# Patient Record
Sex: Male | Born: 1955 | Race: White | Hispanic: No | Marital: Married | Smoking: Never smoker
Health system: Southern US, Community
[De-identification: ages and names within clinical notes are randomized; demographics above are authoritative.]

## PROBLEM LIST (undated history)

## (undated) DIAGNOSIS — B1910 Unspecified viral hepatitis B without hepatic coma: Secondary | ICD-10-CM

## (undated) HISTORY — DX: Unspecified viral hepatitis B without hepatic coma: B19.10

---

## 2005-12-01 ENCOUNTER — Emergency Department (HOSPITAL_COMMUNITY): Admission: EM | Admit: 2005-12-01 | Discharge: 2005-12-01 | Payer: Self-pay | Admitting: Emergency Medicine

## 2006-12-30 ENCOUNTER — Ambulatory Visit: Payer: Self-pay | Admitting: Internal Medicine

## 2007-01-08 ENCOUNTER — Ambulatory Visit: Payer: Self-pay | Admitting: Internal Medicine

## 2007-01-08 LAB — CONVERTED CEMR LAB
ALT: 33 units/L (ref 0–40)
Albumin: 4 g/dL (ref 3.5–5.2)
Alkaline Phosphatase: 50 units/L (ref 39–117)
Bilirubin, Direct: 0.1 mg/dL (ref 0.0–0.3)
Calcium: 9.9 mg/dL (ref 8.4–10.5)
Direct LDL: 150.6 mg/dL
Eosinophils Relative: 0.7 % (ref 0.0–5.0)
GFR calc Af Amer: 102 mL/min
GFR calc non Af Amer: 84 mL/min
Glucose, Bld: 119 mg/dL — ABNORMAL HIGH (ref 70–99)
HCT: 44.1 % (ref 39.0–52.0)
HDL: 35.6 mg/dL — ABNORMAL LOW (ref >39.0)
Hemoglobin, Urine: NEGATIVE
Ketones, ur: NEGATIVE mg/dL
Lymphocytes Relative: 31.8 % (ref 12.0–46.0)
Monocytes Absolute: 0.4 10*3/uL (ref 0.2–0.7)
Monocytes Relative: 5.4 % (ref 3.0–11.0)
Neutro Abs: 4.7 10*3/uL (ref 1.4–7.7)
Neutrophils Relative %: 61.7 % (ref 43.0–77.0)
Potassium: 4.5 meq/L (ref 3.5–5.1)
RBC: 5.06 M/uL (ref 4.22–5.81)
RDW: 12 % (ref 11.5–14.6)
Sodium: 145 meq/L (ref 135–145)
Specific Gravity, Urine: 1.02 (ref 1.000–1.03)
Total Bilirubin: 1.5 mg/dL — ABNORMAL HIGH (ref 0.3–1.2)
Total CHOL/HDL Ratio: 6.6
Triglycerides: 240 mg/dL (ref 0–149)
Urine Glucose: NEGATIVE mg/dL
pH: 8 (ref 5.0–8.0)

## 2007-05-12 ENCOUNTER — Encounter: Payer: Self-pay | Admitting: *Deleted

## 2008-07-26 IMAGING — CR DG CHEST 2V
2 series · 2 of 2 positions shown · non-contrast
Comparison: none

CLINICAL DATA: Physical examination.  
 CHEST - 2 VIEW - 12/30/06:

[view not recorded (1 of 2)]
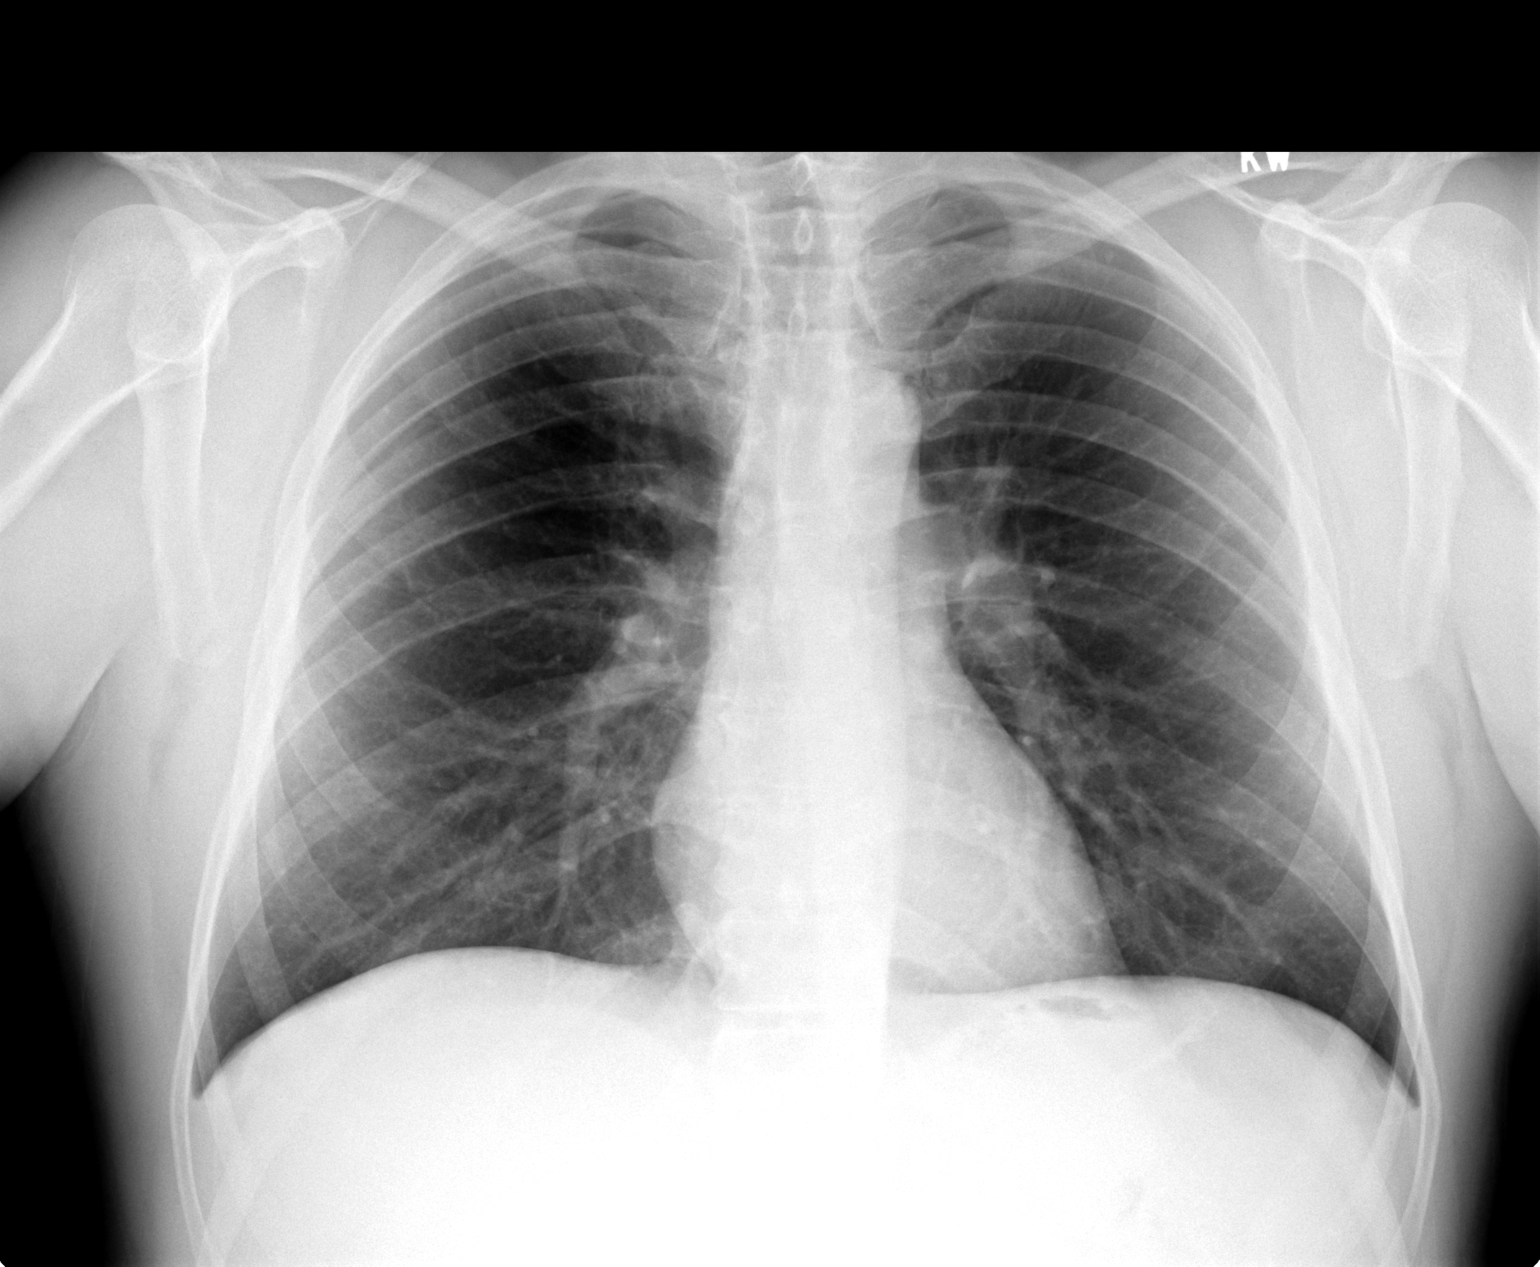

[view not recorded (2 of 2)]
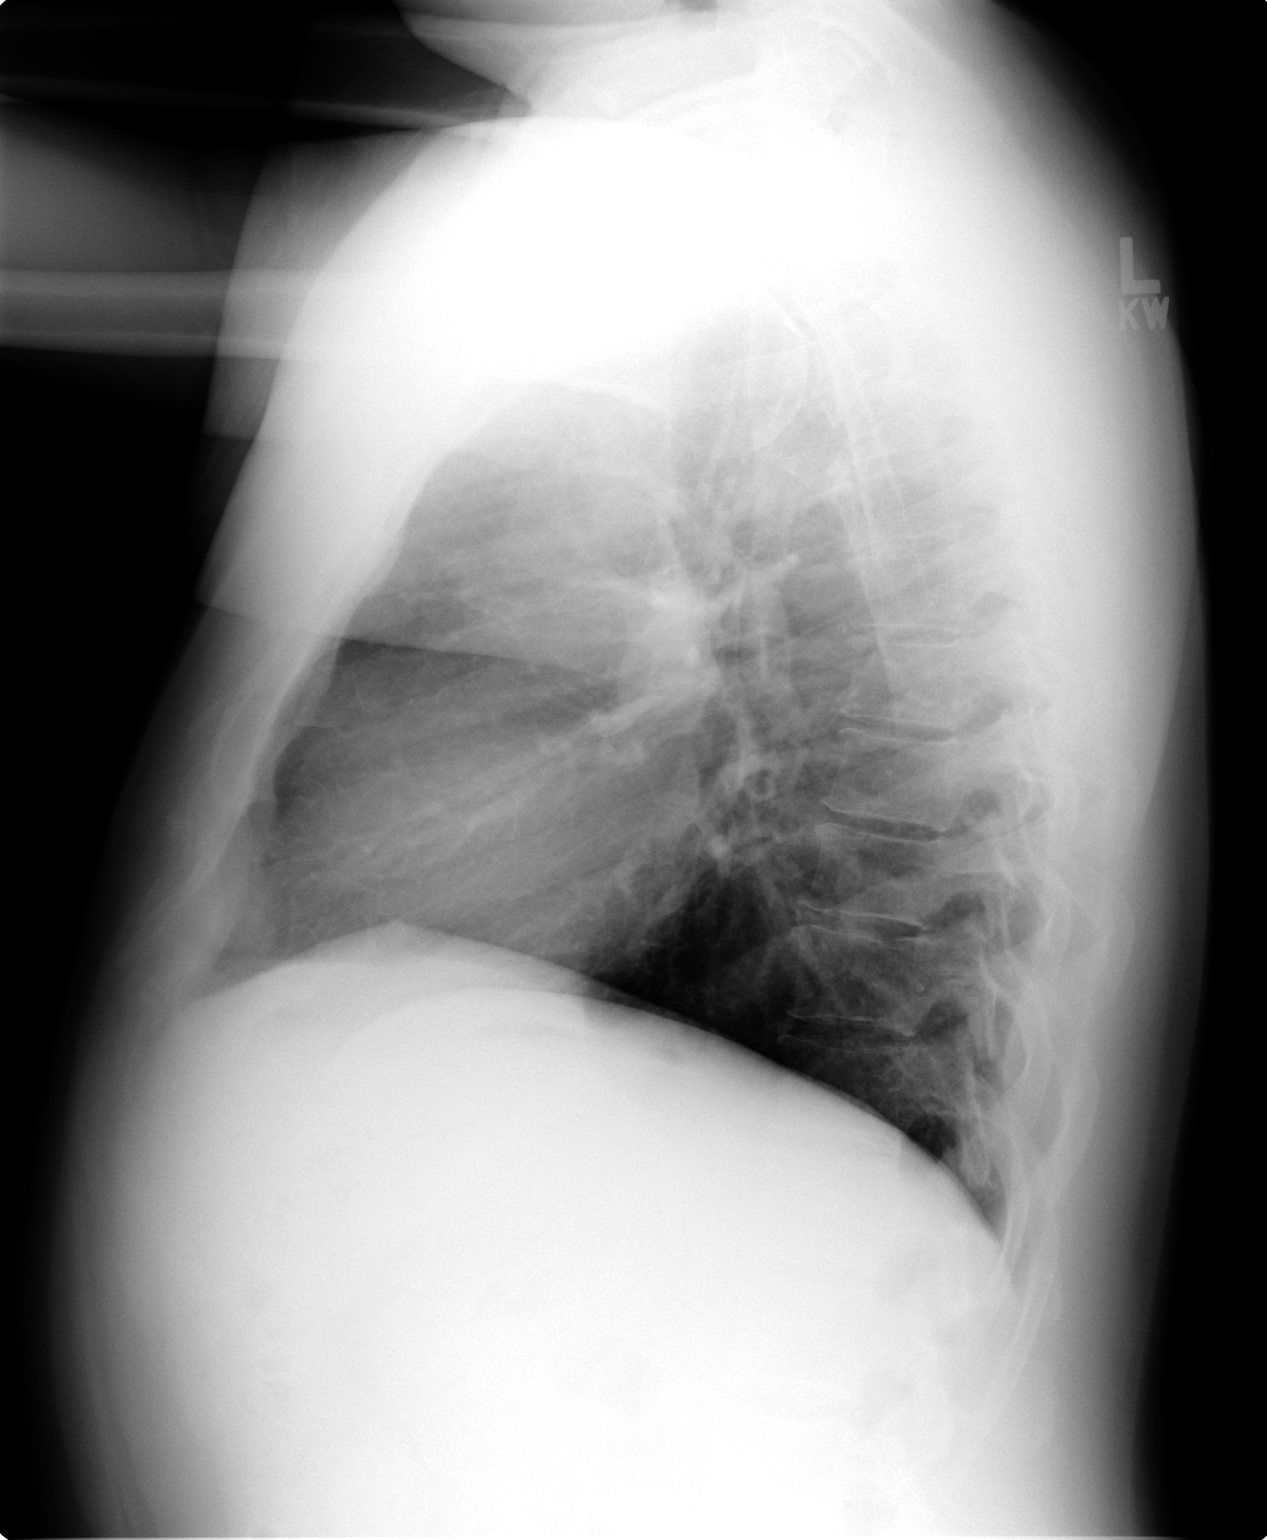

[2 of 2 positions shown; findings below may reference images not displayed]

FINDINGS: The lungs are clear and the heart and mediastinal structures are normal.
IMPRESSION: No evidence for active chest disease.

## 2011-02-09 NOTE — Assessment & Plan Note (Signed)
[facility]                           PRIMARY CARE OFFICE NOTE   NAME:Andrew Mccoy                      MRN:          [id number]  DATE:12/30/2006                            DOB:          02-08-1956    HISTORY OF PRESENT ILLNESS:  The patient is a 55 year old male who  presents for wellness examination.   PAST MEDICAL HISTORY:  Kidney stone on the right.   PAST SURGICAL HISTORY:  None.   ALLERGIES:  None.   MEDICATIONS:  None.   SOCIAL HISTORY:  He is an Nurse, mental health at [employer], originally  from [place].  He has one daughter who is 67 years old.  Quit  smoking 9 years ago.  No alcohol.   FAMILY HISTORY:  Father is 11 and living.  Mother died at age 37 with  myocardial infarctions.  One half sister as well.   REVIEW OF SYSTEMS:  He has been physically very active.  No chest pain  or shortness of breath.  No syncope.  No weight loss.  No blood in the  stools.  The rest of the 18-point review of systems is negative.   PHYSICAL EXAMINATION:  GENERAL:  He was in no acute distress.  VITAL SIGNS:  Blood pressure 123/78, pulse 73, temperature 97.4, weight  189 pounds.  HEENT:  Moist mucosa.  NECK:  Supple.  No thyromegaly or bruits.  LUNGS:  Clear to auscultation and percussion.  No wheezes or rhonchi.  HEART:  S1, S2.  No murmurs or gallops.  ABDOMEN:  Soft, nontender without organomegaly.  EXTREMITIES:  Lower extremities without edema.  NEUROLOGICAL:  He is alert, oriented and cooperative.  Denies being  depressed.  SKIN:  White papular rash over the shaved scalp area.  There is a  0.5x0.3 cm oval elastic mobile mass over the left cheek bone.   LABORATORY DATA:  None available.  He tells me that his PSA was normal 3-  6 months ago, checked by a urologist.   ASSESSMENT/PLAN:  1. Normal wellness examination.  Age/health related issues discussed.      Healthy lifestyle discussed, continue with physical activity.  Mediterranean diet.  2. Scalp rash: likely contact dermatitis.  Hydrocortisone 2.5% lotion      to use b.i.d. p.r.n.  May need to use an electric shaver.  3. Lesion on the left cheek noticed two months ago, likely a lipoma.      If grows, advised to see a dermatologist or plastic surgeon to      perform removal/biopsy.  He understands.  Otherwise, I can see him      back in a year.  4. Obtained lab work appropriate for age.     Andrew Quint. Plotnikov, MD  Electronically Signed    AVP/MedQ  DD: 01/05/2007  DT: 01/05/2007  Job #: [id number]

## 2015-04-27 ENCOUNTER — Emergency Department (HOSPITAL_COMMUNITY)
Admission: EM | Admit: 2015-04-27 | Discharge: 2015-04-27 | Disposition: A | Discharge status: Home / Self Care | Payer: PRIVATE HEALTH INSURANCE | Attending: Emergency Medicine | Admitting: Emergency Medicine

## 2015-04-27 ENCOUNTER — Encounter (HOSPITAL_COMMUNITY): Payer: Self-pay

## 2015-04-27 ENCOUNTER — Emergency Department (HOSPITAL_COMMUNITY): Payer: PRIVATE HEALTH INSURANCE

## 2015-04-27 DIAGNOSIS — Y9355 Activity, bike riding: Secondary | ICD-10-CM | POA: Insufficient documentation

## 2015-04-27 DIAGNOSIS — Y999 Unspecified external cause status: Secondary | ICD-10-CM | POA: Diagnosis not present

## 2015-04-27 DIAGNOSIS — Y92410 Unspecified street and highway as the place of occurrence of the external cause: Secondary | ICD-10-CM | POA: Insufficient documentation

## 2015-04-27 DIAGNOSIS — R0781 Pleurodynia: Secondary | ICD-10-CM

## 2015-04-27 DIAGNOSIS — S4991XA Unspecified injury of right shoulder and upper arm, initial encounter: Secondary | ICD-10-CM | POA: Insufficient documentation

## 2015-04-27 DIAGNOSIS — M549 Dorsalgia, unspecified: Secondary | ICD-10-CM

## 2015-04-27 DIAGNOSIS — Z8619 Personal history of other infectious and parasitic diseases: Secondary | ICD-10-CM | POA: Diagnosis not present

## 2015-04-27 DIAGNOSIS — S299XXA Unspecified injury of thorax, initial encounter: Secondary | ICD-10-CM | POA: Diagnosis not present

## 2015-04-27 MED ORDER — METHOCARBAMOL 500 MG PO TABS: 500.0000 mg | ORAL_TABLET | Freq: Three times a day (TID) | ORAL | Status: DC | PRN

## 2015-04-27 MED ORDER — METHOCARBAMOL 500 MG PO TABS
1000.0000 mg | ORAL_TABLET | Freq: Once | ORAL | Status: AC
  Administered 2015-04-27: 1000 mg via ORAL
  Filled 2015-04-27: qty 2

## 2015-04-27 MED ORDER — OXYCODONE-ACETAMINOPHEN 5-325 MG PO TABS: 2.0000 | ORAL_TABLET | Freq: Once | ORAL | Status: DC

## 2015-04-27 MED ORDER — OXYCODONE-ACETAMINOPHEN 5-325 MG PO TABS
2.0000 | ORAL_TABLET | Freq: Once | ORAL | Status: AC
  Administered 2015-04-27: 2 via ORAL
  Filled 2015-04-27: qty 2
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
## 2015-04-27 NOTE — Discharge Instructions (Signed)
Heat Therapy Heat therapy can help ease sore, stiff, injured, and tight muscles and joints. Heat relaxes your muscles, which may help ease your pain.  RISKS AND COMPLICATIONS If you have any of the following conditions, do not use heat therapy unless your health care provider has approved:  Poor circulation.  Healing wounds or scarred skin in the area being treated.  Diabetes, heart disease, or high blood pressure.  Not being able to feel (numbness) the area being treated.  Unusual swelling of the area being treated.  Active infections.  Blood clots.  Cancer.  Inability to communicate pain. This may include young children and people who have problems with their brain function (dementia).  Pregnancy. Heat therapy should only be used on old, pre-existing, or long-lasting (chronic) injuries. Do not use heat therapy on new injuries unless directed by your health care provider. HOW TO USE HEAT THERAPY There are several different kinds of heat therapy, including:  Moist heat pack.  Warm water bath.  Hot water bottle.  Electric heating pad.  Heated gel pack.  Heated wrap.  Electric heating pad. Use the heat therapy method suggested by your health care provider. Follow your health care provider's instructions on when and how to use heat therapy. GENERAL HEAT THERAPY RECOMMENDATIONS  Do not sleep while using heat therapy. Only use heat therapy while you are awake.  Your skin may turn pink while using heat therapy. Do not use heat therapy if your skin turns red.  Do not use heat therapy if you have new pain.  High heat or long exposure to heat can cause burns. Be careful when using heat therapy to avoid burning your skin.  Do not use heat therapy on areas of your skin that are already irritated, such as with a rash or sunburn. SEEK MEDICAL CARE IF:  You have blisters, redness, swelling, or numbness.  You have new pain.  Your pain is worse. MAKE SURE  YOU:  Understand these instructions.  Will watch your condition.  Will get help right away if you are not doing well or get worse. Document Released: 12/03/2011 Document Revised: 01/25/2014 Document Reviewed: 11/03/2013 [facility] Patient Information 2015 [place], [place]. This information is not intended to replace advice given to you by your health care provider. Make sure you discuss any questions you have with your health care provider.  Musculoskeletal Pain Musculoskeletal pain is muscle and boney aches and pains. These pains can occur in any part of the body. Your caregiver may treat you without knowing the cause of the pain. They may treat you if blood or urine tests, X-rays, and other tests were normal.  CAUSES There is often not a definite cause or reason for these pains. These pains may be caused by a type of germ (virus). The discomfort may also come from overuse. Overuse includes working out too hard when your body is not fit. Boney aches also come from weather changes. Bone is sensitive to atmospheric pressure changes. HOME CARE INSTRUCTIONS   Ask when your test results will be ready. Make sure you get your test results.  Only take over-the-counter or prescription medicines for pain, discomfort, or fever as directed by your caregiver. If you were given medications for your condition, do not drive, operate machinery or power tools, or sign legal documents for 24 hours. Do not drink alcohol. Do not take sleeping pills or other medications that may interfere with treatment.  Continue all activities unless the activities cause more pain. When the pain  lessens, slowly resume normal activities. Gradually increase the intensity and duration of the activities or exercise.  During periods of severe pain, bed rest may be helpful. Lay or sit in any position that is comfortable.  Putting ice on the injured area.  Put ice in a bag.  Place a towel between your skin and the bag.  Leave the  ice on for 15 to 20 minutes, 3 to 4 times a day.  Follow up with your caregiver for continued problems and no reason can be found for the pain. If the pain becomes worse or does not go away, it may be necessary to repeat tests or do additional testing. Your caregiver may need to look further for a possible cause. SEEK IMMEDIATE MEDICAL CARE IF:  You have pain that is getting worse and is not relieved by medications.  You develop chest pain that is associated with shortness or breath, sweating, feeling sick to your stomach (nauseous), or throw up (vomit).  Your pain becomes localized to the abdomen.  You develop any new symptoms that seem different or that concern you. MAKE SURE YOU:   Understand these instructions.  Will watch your condition.  Will get help right away if you are not doing well or get worse. Document Released: 09/10/2005 Document Revised: 12/03/2011 Document Reviewed: 05/15/2013 [facility] Patient Information 2015 [place], [place]. This information is not intended to replace advice given to you by your health care provider. Make sure you discuss any questions you have with your health care provider.  Thoracic Strain Thoracic strain is an injury to the muscles of the upper back. A mild strain may take only 1 week to heal. Torn muscles or tendons may take 6 weeks to 2 months to heal. HOME CARE  Put ice on the injured area.  Put ice in a plastic bag.  Place a towel between your skin and the bag.  Leave the ice on for 15-20 minutes, 03-04 times a day, for the first 2 days.  Only take medicine as told by your doctor.  Go to physical therapy and perform exercises as told by your doctor.  Use wraps and back braces as told by your doctor.  Warm up before being active. GET HELP RIGHT AWAY IF:   There is more bruising, puffiness (swelling), or pain.  Medicine does not help the pain.  You have trouble breathing, chest pain, or a fever.  Your problems seem to be  getting worse, not better. MAKE SURE YOU:   Understand these instructions.  Will watch your condition.  Will get help right away if you are not doing well or get worse. Document Released: 02/27/2008 Document Revised: 12/03/2011 Document Reviewed: 10/30/2010 [facility] Patient Information 2015 [place], [place]. This information is not intended to replace advice given to you by your health care provider. Make sure you discuss any questions you have with your health care provider.

## 2015-04-27 NOTE — ED Notes (Signed)
Patient transported to X-ray 
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
## 2015-06-21 ENCOUNTER — Other Ambulatory Visit: Payer: Self-pay

## 2015-12-15 ENCOUNTER — Encounter: Payer: Self-pay | Admitting: Gastroenterology

## 2015-12-15 ENCOUNTER — Ambulatory Visit (INDEPENDENT_AMBULATORY_CARE_PROVIDER_SITE_OTHER): Payer: PRIVATE HEALTH INSURANCE | Admitting: Gastroenterology

## 2015-12-15 VITALS — BP 130/78 | HR 76 | Ht 65.75 in | Wt 175.4 lb

## 2015-12-15 DIAGNOSIS — Z1211 Encounter for screening for malignant neoplasm of colon: Secondary | ICD-10-CM

## 2015-12-15 DIAGNOSIS — K648 Other hemorrhoids: Secondary | ICD-10-CM | POA: Diagnosis not present

## 2015-12-15 DIAGNOSIS — Z8619 Personal history of other infectious and parasitic diseases: Secondary | ICD-10-CM

## 2015-12-15 DIAGNOSIS — K629 Disease of anus and rectum, unspecified: Secondary | ICD-10-CM | POA: Diagnosis not present

## 2015-12-15 MED ORDER — NA SULFATE-K SULFATE-MG SULF 17.5-3.13-1.6 GM/177ML PO SOLN: 1.0000 | Freq: Once | ORAL | Status: DC

## 2015-12-15 NOTE — Patient Instructions (Signed)
Your physician has requested that you go to the basement for lab work before leaving today.  You have been scheduled for an appointment with Dr. Redmond Pulling at [facility]. Your appointment is on 12/22/15 arriving at 4:00pm for registration. Make certain to bring a list of current medications, including any over the counter medications or vitamins. Also bring your co-pay if you have one as well as your insurance cards. [facility] is located at [address]. Should you need to reschedule your appointment, please contact them at [phone].  You have been scheduled for a colonoscopy. Please follow written instructions given to you at your visit today.  Please pick up your prep supplies at the pharmacy within the next 1-3 days. If you use inhalers (even only as needed), please bring them with you on the day of your procedure. Your physician has requested that you go to www.[email or web address] and enter the access code given to you at your visit today. This web site gives a general overview about your procedure. However, you should still follow specific instructions given to you by our office regarding your preparation for the procedure.

## 2015-12-15 NOTE — Progress Notes (Signed)
HPI :  60 y/o male seen in consultation for a "nodule" near his rectum. He reports a PMH of hepatitis B  He reports about 3 months ago he noticed an abnormality of his perianal area. He has a nodule which is there all the time. He denies any perianal pain. He denies any rectal bleeding. He denies any constipation or diarrhea, regular bowel movements. No abdominal pains. No weight loss. Good appetite, he is eating well. No FH of colon cancer. No prior colonoscopy or colon cancer screening. This doesn't really bother him in any way but he is anxious about what it could be.   I otherwise asked him about his history of hepatitis B. He states he was diagnosed when in [place] in 1988. He reports he was "treated" for it while in [place] but does not know any details of this. He thinks it has "gone away" but he is not sure. He denies any problems with his liver. He denies any history of jaundice. No FH of [place]. He is otherwise in good health. No prior operations.   Past Medical History  Diagnosis Date  . Hepatitis B      History reviewed. No pertinent past surgical history. Family History  Problem Relation Age of Onset  . Heart disease Mother   . Heart disease Father    Social History  Substance Use Topics  . Smoking status: Never Smoker   . Smokeless tobacco: Never Used  . Alcohol Use: No   Current Outpatient Prescriptions  Medication Sig Dispense Refill  . Na Sulfate-K Sulfate-Mg Sulf SOLN Take 1 kit by mouth once. 354 mL 0   No current facility-administered medications for this visit.   No Known Allergies   Review of Systems: All systems reviewed and negative except where noted in HPI.   Lab Results  Component Value Date   WBC 7.6 01/08/2007   HGB 15.3 01/08/2007   HCT 44.1 01/08/2007   MCV 87.0 01/08/2007   PLT 325 01/08/2007    Lab Results  Component Value Date   CREATININE 1.0 01/08/2007   BUN 11 01/08/2007   NA 145 01/08/2007   K 4.5 01/08/2007   CL 109 01/08/2007    CO2 30 01/08/2007   Lab Results  Component Value Date   ALT 33 01/08/2007   AST 26 01/08/2007   ALKPHOS 50 01/08/2007   BILITOT 1.5* 01/08/2007     Physical Exam: BP 130/78 mmHg  Pulse 76  Ht 5' 5.75" (1.67 m)  Wt 175 lb 6 oz (79.55 kg)  BMI 28.52 kg/m2 Constitutional: Pleasant,well-developed, male in no acute distress. HEENT: Normocephalic and atraumatic. Conjunctivae are normal. No scleral icterus. Neck supple.  Cardiovascular: Normal rate, regular rhythm.  Pulmonary/chest: Effort normal and breath sounds normal. No wheezing, rales or rhonchi. Abdominal: Soft, nondistended, nontender. Bowel sounds active throughout. There are no masses palpable. No hepatomegaly. Rectal exam: perianal nodule / cyst on the right lateral side, roughly 1cm in size, firm to touch, nontender without purulence. Internal hemorrhoids noted on anoscopy. No mass on DRE or anoscopy otherwise. Extremities: no edema Lymphadenopathy: No cervical adenopathy noted. Neurological: Alert and oriented to person place and time. Skin: Skin is warm and dry. No rashes noted. Psychiatric: Normal mood and affect. Behavior is normal.   ASSESSMENT AND PLAN: 60 y/o male with a reported history of hepatitis B presenting with perianal findings as above. It appears to possibly be a cyst or subepithelial nodule, seems atypical for hemorrhoidal tissue. Anoscopy shows internal  hemorrhoids but otherwise no mass lesions. I counseled the patient I think this is a benign finding however recommend he have this evaluated by surgery to discuss removing it and to clarify what it is. He agreed with this consultation. He otherwise has no prior colon cancer screening and I discussed colon cancer screening with him. Following our conversation he wished to proceed with optical colonoscopy. The indications, risks, and benefits of colonoscopy were explained to the patient in detail. Risks include but are not limited to bleeding, perforation,  adverse reaction to medications, and cardiopulmonary compromise. Sequelae include but are not limited to the possibility of surgery, hospitalization, and mortality. The patient verbalized understanding and wished to proceed. All questions answered, referred to the scheduler and bowel prep ordered. Further recommendations pending results of the exam.   He otherwise has not been followed for hepatitis B but reports a history of it, although the details of this are unclear. I offered him lab testing to include LFTs, testing for hepatitis B, C, and immunity to A to help sort this out initially. If HBV surface AG is positive will need HBV DNA, HBV E AG / AB and US of the liver. He agreed.   Ramireno Cellar, MD [facility] Pager [phone]  CC: Leighton Ruff, MD

## 2016-01-12 ENCOUNTER — Encounter: Payer: Self-pay | Admitting: Gastroenterology

## 2016-04-16 ENCOUNTER — Other Ambulatory Visit: Payer: Self-pay | Admitting: Family Medicine

## 2016-04-17 LAB — CMP12+LP+TP+TSH+6AC+PSA+CBC…
ALBUMIN: 4.7 g/dL (ref 3.5–5.5)
ALK PHOS: 56 IU/L (ref 39–117)
ALT: 23 IU/L (ref 0–44)
AST: 23 IU/L (ref 0–40)
Albumin/Globulin Ratio: 2.4 — ABNORMAL HIGH (ref 1.2–2.2)
BASOS ABS: 0 10*3/uL (ref 0.0–0.2)
BASOS: 1 %
BILIRUBIN TOTAL: 0.9 mg/dL (ref 0.0–1.2)
BUN / CREAT RATIO: 12 (ref 9–20)
BUN: 12 mg/dL (ref 6–24)
CALCIUM: 9.4 mg/dL (ref 8.7–10.2)
CHLORIDE: 102 mmol/L (ref 96–106)
CHOL/HDL RATIO: 4.9 ratio (ref 0.0–5.0)
CHOLESTEROL TOTAL: 163 mg/dL (ref 100–199)
Creatinine, Ser: 1.04 mg/dL (ref 0.76–1.27)
EOS (ABSOLUTE): 0 10*3/uL (ref 0.0–0.4)
EOS: 1 %
ESTIMATED CHD RISK: 1 times avg. (ref 0.0–1.0)
FREE THYROXINE INDEX: 1.7 (ref 1.2–4.9)
GFR calc non Af Amer: 79 mL/min/{1.73_m2} (ref >59)
GFR, EST AFRICAN AMERICAN: 91 mL/min/{1.73_m2} (ref >59)
GGT: 27 IU/L (ref 0–65)
GLUCOSE: 102 mg/dL — AB (ref 65–99)
Globulin, Total: 2 g/dL (ref 1.5–4.5)
HDL: 33 mg/dL — ABNORMAL LOW (ref >39)
HEMATOCRIT: 37.8 % (ref 37.5–51.0)
HEMOGLOBIN: 12.6 g/dL (ref 12.6–17.7)
IMMATURE GRANULOCYTES: 1 %
Immature Grans (Abs): 0.1 10*3/uL (ref 0.0–0.1)
Iron: 85 ug/dL (ref 38–169)
LDH: 415 IU/L — AB (ref 121–224)
LDL CALC: 106 mg/dL — AB (ref 0–99)
Lymphocytes Absolute: 1.5 10*3/uL (ref 0.7–3.1)
Lymphs: 28 %
MCH: 29.4 pg (ref 26.6–33.0)
MCHC: 33.3 g/dL (ref 31.5–35.7)
MCV: 88 fL (ref 79–97)
MONOCYTES: 3 %
Monocytes Absolute: 0.2 10*3/uL (ref 0.1–0.9)
NEUTROS PCT: 66 %
Neutrophils Absolute: 3.5 10*3/uL (ref 1.4–7.0)
PLATELETS: 312 10*3/uL (ref 150–379)
PROSTATE SPECIFIC AG, SERUM: 0.3 ng/mL (ref 0.0–4.0)
Phosphorus: 3.2 mg/dL (ref 2.5–4.5)
Potassium: 4.6 mmol/L (ref 3.5–5.2)
RBC: 4.28 x10E6/uL (ref 4.14–5.80)
RDW: 17.9 % — ABNORMAL HIGH (ref 12.3–15.4)
SODIUM: 140 mmol/L (ref 134–144)
T3 Uptake Ratio: 26 % (ref 24–39)
T4, Total: 6.6 ug/dL (ref 4.5–12.0)
TSH: 3.52 u[IU]/mL (ref 0.450–4.500)
Total Protein: 6.7 g/dL (ref 6.0–8.5)
Triglycerides: 120 mg/dL (ref 0–149)
Uric Acid: 7.6 mg/dL (ref 3.7–8.6)
VLDL CHOLESTEROL CAL: 24 mg/dL (ref 5–40)
WBC: 5.4 10*3/uL (ref 3.4–10.8)

## 2016-04-17 LAB — HGB A1C W/O EAG: Hgb A1c MFr Bld: 5.7 % — ABNORMAL HIGH (ref 4.8–5.6)

## 2016-08-27 DIAGNOSIS — J342 Deviated nasal septum: Secondary | ICD-10-CM | POA: Insufficient documentation

## 2016-08-27 DIAGNOSIS — M2740 Unspecified cyst of jaw: Secondary | ICD-10-CM | POA: Insufficient documentation

## 2016-08-27 DIAGNOSIS — R040 Epistaxis: Secondary | ICD-10-CM | POA: Insufficient documentation

## 2017-04-09 ENCOUNTER — Ambulatory Visit: Payer: Self-pay | Admitting: *Deleted

## 2017-04-09 VITALS — BP 125/80 | Ht 67.0 in | Wt 177.0 lb

## 2017-04-09 DIAGNOSIS — Z0000 Encounter for general adult medical examination without abnormal findings: Secondary | ICD-10-CM

## 2017-04-09 NOTE — Progress Notes (Signed)
[employer] insurance premium discount evaluation: Labs Drawn. Replacements ROI form signed. Tobacco Free Attestation form signed.  Forms placed in paper chart.  Ok to route results to PCP per pt.

## 2017-04-10 LAB — CMP12+LP+TP+TSH+6AC+PSA+CBC…
ALBUMIN: 4.9 g/dL (ref 3.5–5.5)
ALK PHOS: 65 IU/L (ref 39–117)
ALT: 22 IU/L (ref 0–44)
AST: 29 IU/L (ref 0–40)
Albumin/Globulin Ratio: 2.3 — ABNORMAL HIGH (ref 1.2–2.2)
BASOS: 0 %
BUN / CREAT RATIO: 13 (ref 9–20)
BUN: 14 mg/dL (ref 6–24)
Basophils Absolute: 0 10*3/uL (ref 0.0–0.2)
Bilirubin Total: 1.3 mg/dL — ABNORMAL HIGH (ref 0.0–1.2)
CALCIUM: 9.8 mg/dL (ref 8.7–10.2)
CHLORIDE: 100 mmol/L (ref 96–106)
CHOL/HDL RATIO: 5.5 ratio — AB (ref 0.0–5.0)
CHOLESTEROL TOTAL: 188 mg/dL (ref 100–199)
Creatinine, Ser: 1.1 mg/dL (ref 0.76–1.27)
EOS (ABSOLUTE): 0 10*3/uL (ref 0.0–0.4)
EOS: 1 %
ESTIMATED CHD RISK: 1.2 times avg. — AB (ref 0.0–1.0)
FREE THYROXINE INDEX: 1.9 (ref 1.2–4.9)
GFR calc Af Amer: 84 mL/min/{1.73_m2} (ref >59)
GFR, EST NON AFRICAN AMERICAN: 73 mL/min/{1.73_m2} (ref >59)
GGT: 25 IU/L (ref 0–65)
Globulin, Total: 2.1 g/dL (ref 1.5–4.5)
Glucose: 102 mg/dL — ABNORMAL HIGH (ref 65–99)
HDL: 34 mg/dL — ABNORMAL LOW (ref >39)
HEMATOCRIT: 40.1 % (ref 37.5–51.0)
HEMOGLOBIN: 12.8 g/dL — AB (ref 13.0–17.7)
Immature Grans (Abs): 0.2 10*3/uL — ABNORMAL HIGH (ref 0.0–0.1)
Immature Granulocytes: 3 %
Iron: 106 ug/dL (ref 38–169)
LDH: 540 IU/L — ABNORMAL HIGH (ref 121–224)
LDL CALC: 123 mg/dL — AB (ref 0–99)
LYMPHS ABS: 1.6 10*3/uL (ref 0.7–3.1)
Lymphs: 29 %
MCH: 30 pg (ref 26.6–33.0)
MCHC: 31.9 g/dL (ref 31.5–35.7)
MCV: 94 fL (ref 79–97)
Monocytes Absolute: 0.2 10*3/uL (ref 0.1–0.9)
Monocytes: 3 %
NEUTROS ABS: 3.6 10*3/uL (ref 1.4–7.0)
Neutrophils: 64 %
PHOSPHORUS: 3.9 mg/dL (ref 2.5–4.5)
Platelets: 279 10*3/uL (ref 150–379)
Potassium: 4.6 mmol/L (ref 3.5–5.2)
Prostate Specific Ag, Serum: 0.3 ng/mL (ref 0.0–4.0)
RBC: 4.27 x10E6/uL (ref 4.14–5.80)
RDW: 17.2 % — ABNORMAL HIGH (ref 12.3–15.4)
SODIUM: 139 mmol/L (ref 134–144)
T3 Uptake Ratio: 26 % (ref 24–39)
T4, Total: 7.4 ug/dL (ref 4.5–12.0)
TSH: 4.89 u[IU]/mL — AB (ref 0.450–4.500)
Total Protein: 7 g/dL (ref 6.0–8.5)
Triglycerides: 154 mg/dL — ABNORMAL HIGH (ref 0–149)
Uric Acid: 8 mg/dL (ref 3.7–8.6)
VLDL Cholesterol Cal: 31 mg/dL (ref 5–40)
WBC: 5.6 10*3/uL (ref 3.4–10.8)

## 2017-04-10 LAB — HGB A1C W/O EAG: HEMOGLOBIN A1C: 5.7 % — AB (ref 4.8–5.6)

## 2017-04-12 NOTE — Progress Notes (Signed)
Results reviewed with pt. A1c unchanged from last year. Lipid panel worsened. Bilirubin and LDH elevated. Hx hepatitis B. TSH elevated, previously normal. Hgb decreased and RDW elevated, similar to previous. Denies any s/sx blood loss. Diet and exercise recommendations for lipids and A1c discussed. Denies etoh or supplement use.  Results routed to PCP. Copy provided to pt. No further questions/concerns.

## 2017-08-20 ENCOUNTER — Ambulatory Visit: Payer: Self-pay | Admitting: Registered Nurse

## 2017-08-20 VITALS — BP 127/81 | HR 68 | Temp 97.9°F

## 2017-08-20 DIAGNOSIS — L03012 Cellulitis of left finger: Secondary | ICD-10-CM

## 2017-08-20 MED ORDER — TRIPLE ANTIBIOTIC 5-400-5000 EX OINT: TOPICAL_OINTMENT | Freq: Four times a day (QID) | CUTANEOUS | 0 refills | Status: DC

## 2017-08-20 MED ORDER — SULFAMETHOXAZOLE-TRIMETHOPRIM 800-160 MG PO TABS: 1.0000 | ORAL_TABLET | Freq: Two times a day (BID) | ORAL | 0 refills | Status: AC

## 2017-08-20 NOTE — Patient Instructions (Signed)
Laceration Care, Adult A laceration is a cut that goes through all layers of the skin. The cut also goes into the tissue that is right under the skin. Some cuts heal on their own. Others need to be closed with stitches (sutures), staples, skin adhesive strips, or wound glue. Taking care of your cut lowers your risk of infection and helps your cut to heal better. How to take care of your cut For stitches or staples:  Keep the wound clean and dry.  If you were given a bandage (dressing), you should change it at least one time per day or as told by your doctor. You should also change it if it gets wet or dirty.  Keep the wound completely dry for the first 24 hours or as told by your doctor. After that time, you may take a shower or a bath. However, make sure that the wound is not soaked in water until after the stitches or staples have been removed.  Clean the wound one time each day or as told by your doctor: ? Wash the wound with soap and water. ? Rinse the wound with water until all of the soap comes off. ? Pat the wound dry with a clean towel. Do not rub the wound.  After you clean the wound, put a thin layer of antibiotic ointment on it as told by your doctor. This ointment: ? Helps to prevent infection. ? Keeps the bandage from sticking to the wound.  Have your stitches or staples removed as told by your doctor. If your doctor used skin adhesive strips:  Keep the wound clean and dry.  If you were given a bandage, you should change it at least one time per day or as told by your doctor. You should also change it if it gets dirty or wet.  Do not get the skin adhesive strips wet. You can take a shower or a bath, but be careful to keep the wound dry.  If the wound gets wet, pat it dry with a clean towel. Do not rub the wound.  Skin adhesive strips fall off on their own. You can trim the strips as the wound heals. Do not remove any strips that are still stuck to the wound. They will fall  off after a while. If your doctor used wound glue:  Try to keep your wound dry, but you may briefly wet it in the shower or bath. Do not soak the wound in water, such as by swimming.  After you take a shower or a bath, gently pat the wound dry with a clean towel. Do not rub the wound.  Do not do any activities that will make you really sweaty until the skin glue has fallen off on its own.  Do not apply liquid, cream, or ointment medicine to your wound while the skin glue is still on.  If you were given a bandage, you should change it at least one time per day or as told by your doctor. You should also change it if it gets dirty or wet.  If a bandage is placed over the wound, do not let the tape for the bandage touch the skin glue.  Do not pick at the glue. The skin glue usually stays on for 5-10 days. Then, it falls off of the skin. General Instructions  To help prevent scarring, make sure to cover your wound with sunscreen whenever you are outside after stitches are removed, after adhesive strips are removed, or   when wound glue stays in place and the wound is healed. Make sure to wear a sunscreen of at least 30 SPF.  Take over-the-counter and prescription medicines only as told by your doctor.  If you were given antibiotic medicine or ointment, take or apply it as told by your doctor. Do not stop using the antibiotic even if your wound is getting better.  Do not scratch or pick at the wound.  Keep all follow-up visits as told by your doctor. This is important.  Check your wound every day for signs of infection. Watch for: ? Redness, swelling, or pain. ? Fluid, blood, or pus.  Raise (elevate) the injured area above the level of your heart while you are sitting or lying down, if possible. Get help if:  You got a tetanus shot and you have any of these problems at the injection site: ? Swelling. ? Very bad pain. ? Redness. ? Bleeding.  You have a fever.  A wound that was  closed breaks open.  You notice a bad smell coming from your wound or your bandage.  You notice something coming out of the wound, such as wood or glass.  Medicine does not help your pain.  You have more redness, swelling, or pain at the site of your wound.  You have fluid, blood, or pus coming from your wound.  You notice a change in the color of your skin near your wound.  You need to change the bandage often because fluid, blood, or pus is coming from the wound.  You start to have a new rash.  You start to have numbness around the wound. Get help right away if:  You have very bad swelling around the wound.  Your pain suddenly gets worse and is very bad.  You notice painful lumps near the wound or on skin that is anywhere on your body.  You have a red streak going away from your wound.  The wound is on your hand or foot and you cannot move a finger or toe like you usually can.  The wound is on your hand or foot and you notice that your fingers or toes look pale or bluish. This information is not intended to replace advice given to you by your health care provider. Make sure you discuss any questions you have with your health care provider. Document Released: 02/27/2008 Document Revised: 02/16/2016 Document Reviewed: 09/06/2014 Elsevier Interactive Patient Education  2018 [employer]. Cellulitis, Adult Cellulitis is a skin infection. The infected area is usually red and tender. This condition occurs most often in the arms and lower legs. The infection can travel to the muscles, blood, and underlying tissue and become serious. It is very important to get treated for this condition. What are the causes? Cellulitis is caused by bacteria. The bacteria enter through a break in the skin, such as a cut, burn, insect bite, open sore, or crack. What increases the risk? This condition is more likely to occur in people who:  Have a weak defense system (immune system).  Have open  wounds on the skin such as cuts, burns, bites, and scrapes. Bacteria can enter the body through these open wounds.  Are older.  Have diabetes.  Have a type of long-lasting (chronic) liver disease (cirrhosis) or kidney disease.  Use IV drugs.  What are the signs or symptoms? Symptoms of this condition include:  Redness, streaking, or spotting on the skin.  Swollen area of the skin.  Tenderness or pain  when an area of the skin is touched.  Warm skin.  Fever.  Chills.  Blisters.  How is this diagnosed? This condition is diagnosed based on a medical history and physical exam. You may also have tests, including:  Blood tests.  Lab tests.  Imaging tests.  How is this treated? Treatment for this condition may include:  Medicines, such as antibiotic medicines or antihistamines.  Supportive care, such as rest and application of cold or warm cloths (cold or warm compresses) to the skin.  Hospital care, if the condition is severe.  The infection usually gets better within 1-2 days of treatment. Follow these instructions at home:  Take over-the-counter and prescription medicines only as told by your health care provider.  If you were prescribed an antibiotic medicine, take it as told by your health care provider. Do not stop taking the antibiotic even if you start to feel better.  Drink enough fluid to keep your urine clear or pale yellow.  Do not touch or rub the infected area.  Raise (elevate) the infected area above the level of your heart while you are sitting or lying down.  Apply warm or cold compresses to the affected area as told by your health care provider.  Keep all follow-up visits as told by your health care provider. This is important. These visits let your health care provider make sure a more serious infection is not developing. Contact a health care provider if:  You have a fever.  Your symptoms do not improve within 1-2 days of starting  treatment.  Your bone or joint underneath the infected area becomes painful after the skin has healed.  Your infection returns in the same area or another area.  You notice a swollen bump in the infected area.  You develop new symptoms.  You have a general ill feeling (malaise) with muscle aches and pains. Get help right away if:  Your symptoms get worse.  You feel very sleepy.  You develop vomiting or diarrhea that persists.  You notice red streaks coming from the infected area.  Your red area gets larger or turns dark in color. This information is not intended to replace advice given to you by your health care provider. Make sure you discuss any questions you have with your health care provider. Document Released: 06/20/2005 Document Revised: 01/19/2016 Document Reviewed: 07/20/2015 Elsevier Interactive Patient Education  2017 [employer].

## 2017-08-20 NOTE — Progress Notes (Signed)
Subjective:    Patient ID: Andrew Mccoy, male    DOB: 04-22-1956, 61 y.o.   MRN: [id number]  61y/o caucasian male established Pt cut L lateral palm on piece of crystal on 11/16. Seen at [facility] see note in [facility]. Laceration sutured. Follow up visit yesterday and had sutures removed. Per [facility] notes, "wound was clean and dry with little surrounding erythema, no signs of infection. Some medial hand swelling, likely r/t healing." Wound covered with abx cream and clean dressing." Pt presents today, no dressing present. States MD yesterday told him it was a little red but would be better today, but redness is worse and now with 4 yellow "dots" at sites where sutures were removed from. Small amount of yellow drainage noted oozing from one site. Pt reports slight pain and more swelling than yesterday.  Stated applied OTC neosporin yesterday and this morning and drainage has dried up.      Review of Systems  Constitutional: Negative for chills, fatigue and fever.  HENT: Negative for trouble swallowing and voice change.   Eyes: Negative for photophobia and visual disturbance.  Respiratory: Negative for choking, shortness of breath, wheezing and stridor.   Cardiovascular: Negative for chest pain.  Gastrointestinal: Negative for nausea and vomiting.  Endocrine: Negative for cold intolerance and heat intolerance.  Genitourinary: Negative for difficulty urinating.  Musculoskeletal: Positive for joint swelling and myalgias. Negative for arthralgias, back pain, gait problem, neck pain and neck stiffness.  Skin: Positive for color change, rash and wound. Negative for pallor.  Allergic/Immunologic: Negative for food allergies.  Neurological: Negative for tremors, weakness and numbness.  Hematological: Negative for adenopathy. Does not bruise/bleed easily.  Psychiatric/Behavioral: Negative for agitation, confusion and sleep disturbance.       Objective:   Physical Exam  Constitutional: He  is oriented to person, place, and time. Vital signs are normal. He appears well-developed and well-nourished. He is cooperative.  Non-toxic appearance. He does not have a sickly appearance. He does not appear ill. No distress.  HENT:  Head: Normocephalic and atraumatic.  Right Ear: Hearing and external ear normal.  Left Ear: Hearing and external ear normal.  Nose: Nose normal.  Mouth/Throat: Uvula is midline, oropharynx is clear and moist and mucous membranes are normal. No oropharyngeal exudate.  Eyes: Conjunctivae, EOM and lids are normal. Pupils are equal, round, and reactive to light. Right eye exhibits no discharge. Left eye exhibits no discharge. No scleral icterus.  Neck: Trachea normal, normal range of motion and phonation normal. Neck supple. No neck rigidity. No tracheal deviation, no edema, no erythema and normal range of motion present.  Cardiovascular: Normal rate, regular rhythm, normal heart sounds and intact distal pulses.  Pulses:      Radial pulses are 2+ on the right side, and 2+ on the left side.  Pulmonary/Chest: Effort normal and breath sounds normal. No accessory muscle usage or stridor. No respiratory distress. He has no decreased breath sounds. He has no wheezes. He has no rhonchi. He has no rales.  Abdominal: Soft. He exhibits no distension. There is no guarding.  Musculoskeletal: Normal range of motion. He exhibits edema. He exhibits no deformity.       Right shoulder: Normal.       Left shoulder: Normal.       Right elbow: Normal.      Left elbow: Normal.       Right wrist: Normal.       Left wrist: Normal.  Right hip: Normal.       Left hip: Normal.       Right knee: Normal.       Left knee: Normal.       Cervical back: Normal.       Left hand: He exhibits tenderness, laceration and swelling. He exhibits normal range of motion, no bony tenderness, normal two-point discrimination, normal capillary refill and no deformity. Normal sensation noted. Normal  strength noted. He exhibits no finger abduction, no thumb/finger opposition and no wrist extension trouble.       Hands: Nonpitting 1+/4 edema lateral left hand and 5th digit proximal MCP joint and between MCP and PIP joints; full arom/strength equal bilaterally 5/5  Neurological: He is alert and oriented to person, place, and time. He has normal strength. He displays no atrophy and no tremor. No cranial nerve deficit or sensory deficit. He exhibits normal muscle tone. He displays no seizure activity. Coordination and gait normal. GCS eye subscore is 4. GCS verbal subscore is 5. GCS motor subscore is 6.  In/out of chair without difficulty; gait sure and steady in hallway  Skin: Skin is warm and dry. Laceration and rash noted. No abrasion, no bruising, no burn, no ecchymosis, no lesion, no petechiae and no purpura noted. Rash is macular and pustular. Rash is not papular, not maculopapular, not nodular, not vesicular and not urticarial. He is not diaphoretic. There is erythema. No cyanosis. No pallor. Nails show no clubbing.     Nonpitting edema 5th MTP soft tissue extending 1cm into hand and between MCP and PIP joints; pustules noted where sutures removed x 4; erythema hot to touch lateral finger/hand; wound edges well approximated no discharge noted  Psychiatric: He has a normal mood and affect. His speech is normal and behavior is normal. Judgment and thought content normal. Cognition and memory are normal.          Assessment & Plan:  A-left hand finger cellulitis, laceration follow up  P-bactrim ds po BID x 7 days #40 RF0 dispensed from [facility].  Continue OTC neosporin QID topical affected area until completely healed.  Exitcare handout on skin infection given to patient. RTC if worsening erythema, pain, purulent discharge, fever. Wash towels, washcloths, sheets in hot water with bleach every couple of days until infection resolved. Patient verbalized understanding, agreed with plan of care and  had no further questions at this time.   Patient was instructed to rest, ice and elevate left hand. Do not soak hand until lacerations healed avoid pool, lake, hot tub, dirty sink water. Exitcare handout on  Laceration care given to patient. Medications as directed. Call or return to clinic as needed if these symptoms worsen or fail to improve as anticipated e.g. Pain/swelling/rash.  Patient verbalized agreement and understanding of treatment plan. P2: ROM, injury prevention

## 2018-01-28 ENCOUNTER — Ambulatory Visit: Payer: Self-pay | Admitting: Registered Nurse

## 2018-01-28 VITALS — BP 113/70 | HR 63 | Temp 97.9°F

## 2018-01-28 DIAGNOSIS — J069 Acute upper respiratory infection, unspecified: Secondary | ICD-10-CM

## 2018-01-28 MED ORDER — FLUTICASONE PROPIONATE 50 MCG/ACT NA SUSP: 1.0000 | Freq: Two times a day (BID) | NASAL | 6 refills | Status: DC | PRN

## 2018-01-28 MED ORDER — SALINE SPRAY 0.65 % NA SOLN: 2.0000 | NASAL | 0 refills | Status: AC

## 2018-01-28 MED ORDER — LORATADINE 10 MG PO TABS: 10.0000 mg | ORAL_TABLET | Freq: Every day | ORAL | 1 refills | Status: DC

## 2018-01-28 MED ORDER — PHENYLEPHRINE HCL 5 MG PO TABS: 5.0000 mg | ORAL_TABLET | Freq: Four times a day (QID) | ORAL | 0 refills | Status: AC | PRN

## 2018-01-28 NOTE — Patient Instructions (Signed)
Pharyngitis Pharyngitis is redness, pain, and swelling (inflammation) of the throat (pharynx). It is a very common cause of sore throat. Pharyngitis can be caused by a bacteria, but it is usually caused by a virus. Most cases of pharyngitis get better on their own without treatment. What are the causes? This condition may be caused by:  Infection by viruses (viral). Viral pharyngitis spreads from person to person (is contagious) through coughing, sneezing, and sharing of personal items or utensils such as cups, forks, spoons, and toothbrushes.  Infection by bacteria (bacterial). Bacterial pharyngitis may be spread by touching the nose or face after coming in contact with the bacteria, or through more intimate contact, such as kissing.  Allergies. Allergies can cause buildup of mucus in the throat (post-nasal drip), leading to inflammation and irritation. Allergies can also cause blocked nasal passages, forcing breathing through the mouth, which dries and irritates the throat.  What increases the risk? You are more likely to develop this condition if:  You are 5-24 years old.  You are exposed to crowded environments such as daycare, school, or dormitory living.  You live in a cold climate.  You have a weakened disease-fighting (immune) system.  What are the signs or symptoms? Symptoms of this condition vary by the cause (viral, bacterial, or allergies) and can include:  Sore throat.  Fatigue.  Low-grade fever.  Headache.  Joint pain and muscle aches.  Skin rashes.  Swollen glands in the throat (lymph nodes).  Plaque-like film on the throat or tonsils. This is often a symptom of bacterial pharyngitis.  Vomiting.  Stuffy nose (nasal congestion).  Cough.  Red, itchy eyes (conjunctivitis).  Loss of appetite.  How is this diagnosed? This condition is often diagnosed based on your medical history and a physical exam. Your health care provider will ask you questions about  your illness and your symptoms. A swab of your throat may be done to check for bacteria (rapid strep test). Other lab tests may also be done, depending on the suspected cause, but these are rare. How is this treated? This condition usually gets better in 3-4 days without medicine. Bacterial pharyngitis may be treated with antibiotic medicines. Follow these instructions at home:  Take over-the-counter and prescription medicines only as told by your health care provider. ? If you were prescribed an antibiotic medicine, take it as told by your health care provider. Do not stop taking the antibiotic even if you start to feel better. ? Do not give children aspirin because of the association with Reye syndrome.  Drink enough water and fluids to keep your urine clear or pale yellow.  Get a lot of rest.  Gargle with a salt-water mixture 3-4 times a day or as needed. To make a salt-water mixture, completely dissolve -1 tsp of salt in 1 cup of warm water.  If your health care provider approves, you may use throat lozenges or sprays to soothe your throat. Contact a health care provider if:  You have large, tender lumps in your neck.  You have a rash.  You cough up green, yellow-brown, or bloody spit. Get help right away if:  Your neck becomes stiff.  You drool or are unable to swallow liquids.  You cannot drink or take medicines without vomiting.  You have severe pain that does not go away, even after you take medicine.  You have trouble breathing, and it is not caused by a stuffy nose.  You have new pain and swelling in your joints   such as the knees, ankles, wrists, or elbows. Summary  Pharyngitis is redness, pain, and swelling (inflammation) of the throat (pharynx).  While pharyngitis can be caused by a bacteria, the most common causes are viral.  Most cases of pharyngitis get better on their own without treatment.  Bacterial pharyngitis is treated with antibiotic medicines. This  information is not intended to replace advice given to you by your health care provider. Make sure you discuss any questions you have with your health care provider. Document Released: 09/10/2005 Document Revised: 10/16/2016 Document Reviewed: 10/16/2016 Elsevier Interactive Patient Education  2018 [employer]. Sinus Rinse What is a sinus rinse? A sinus rinse is a simple home treatment that is used to rinse your sinuses with a sterile mixture of salt and water (saline solution). Sinuses are air-filled spaces in your skull behind the bones of your face and forehead that open into your nasal cavity. You will use the following:  Saline solution.  Neti pot or spray bottle. This releases the saline solution into your nose and through your sinuses. Neti pots and spray bottles can be purchased at [employer], a health food store, or online.  When would I do a sinus rinse? A sinus rinse can help to clear mucus, dirt, dust, or pollen from the nasal cavity. You may do a sinus rinse when you have a cold, a virus, nasal allergy symptoms, a sinus infection, or stuffiness in the nose or sinuses. If you are considering a sinus rinse:  Ask your child's health care provider before performing a sinus rinse on your child.  Do not do a sinus rinse if you have had ear or nasal surgery, ear infection, or blocked ears.  How do I do a sinus rinse?  Wash your hands.  Disinfect your device according to the directions provided and then dry it.  Use the solution that comes with your device or one that is sold separately in stores. Follow the mixing directions on the package.  Fill your device with the amount of saline solution as directed by the device instructions.  Stand over a sink and tilt your head sideways over the sink.  Place the spout of the device in your upper nostril (the one closer to the ceiling).  Gently pour or squeeze the saline solution into the nasal cavity. The liquid should drain  to the lower nostril if you are not overly congested.  Gently blow your nose. Blowing too hard may cause ear pain.  Repeat in the other nostril.  Clean and rinse your device with clean water and then air-dry it. Are there risks of a sinus rinse? Sinus rinse is generally very safe and effective. However, there are a few risks, which include:  A burning sensation in the sinuses. This may happen if you do not make the saline solution as directed. Make sure to follow all directions when making the saline solution.  Infection from contaminated water. This is rare, but possible.  Nasal irritation.  This information is not intended to replace advice given to you by your health care provider. Make sure you discuss any questions you have with your health care provider. Document Released: 04/07/2014 Document Revised: 08/07/2016 Document Reviewed: 01/26/2014 Elsevier Interactive Patient Education  2017 [place]. Viral Respiratory Infection A respiratory infection is an illness that affects part of the respiratory system, such as the lungs, nose, or throat. Most respiratory infections are caused by either viruses or bacteria. A respiratory infection that is caused by a  virus is called a viral respiratory infection. Common types of viral respiratory infections include:  A cold.  The flu (influenza).  A respiratory syncytial virus (RSV) infection.  How do I know if I have a viral respiratory infection? Most viral respiratory infections cause:  A stuffy or runny nose.  Yellow or green nasal discharge.  A cough.  Sneezing.  Fatigue.  Achy muscles.  A sore throat.  Sweating or chills.  A fever.  A headache.  How are viral respiratory infections treated? If influenza is diagnosed early, it may be treated with an antiviral medicine that shortens the length of time a person has symptoms. Symptoms of viral respiratory infections may be treated with over-the-counter and  prescription medicines, such as:  Expectorants. These make it easier to cough up mucus.  Decongestant nasal sprays.  Health care providers do not prescribe antibiotic medicines for viral infections. This is because antibiotics are designed to kill bacteria. They have no effect on viruses. How do I know if I should stay home from work or school? To avoid exposing others to your respiratory infection, stay home if you have:  A fever.  A persistent cough.  A sore throat.  A runny nose.  Sneezing.  Muscles aches.  Headaches.  Fatigue.  Weakness.  Chills.  Sweating.  Nausea.  Follow these instructions at home:  Rest as much as possible.  Take over-the-counter and prescription medicines only as told by your health care provider.  Drink enough fluid to keep your urine clear or pale yellow. This helps prevent dehydration and helps loosen up mucus.  Gargle with a salt-water mixture 3-4 times per day or as needed. To make a salt-water mixture, completely dissolve -1 tsp of salt in 1 cup of warm water.  Use nose drops made from salt water to ease congestion and soften raw skin around your nose.  Do not drink alcohol.  Do not use tobacco products, including cigarettes, chewing tobacco, and e-cigarettes. If you need help quitting, ask your health care provider. Contact a health care provider if:  Your symptoms last for 10 days or longer.  Your symptoms get worse over time.  You have a fever.  You have severe sinus pain in your face or forehead.  The glands in your jaw or neck become very swollen. Get help right away if:  You feel pain or pressure in your chest.  You have shortness of breath.  You faint or feel like you will faint.  You have severe and persistent vomiting.  You feel confused or disoriented. This information is not intended to replace advice given to you by your health care provider. Make sure you discuss any questions you have with your  health care provider. Document Released: 06/20/2005 Document Revised: 02/16/2016 Document Reviewed: 02/16/2015 Elsevier Interactive Patient Education  [employer].

## 2018-01-28 NOTE — Progress Notes (Signed)
Subjective:    Patient ID: Andrew Mccoy, male    DOB: May 12, 1956, 62 y.o.   MRN: [id number]  62y/o Caucasian established male pt reporting sore throat since awakening yesterday. Denies chest congestion, cough, dysphasia, dysphagia, wheezing, SOB, nasal congestion, rhinorrhea, sinus pain/pressure, HA, otalgia. Feels like he may feel a little weak/fatigues easier and sweating easier today at work in addition to the sore throat but denies any other sx. Has only tried throat spray for sore throat. ?Chloraseptic type "spray to calm throat." States in previous years, allergies will sometimes bother him when he stays outside too much and will make him "feel like I am choking." Denies this sensation currently or any other allergy sx this season. Denies using Flonase in the past.  + sick contacts at work with similar symptoms     Review of Systems  Constitutional: Positive for fatigue. Negative for activity change, appetite change, chills, diaphoresis, fever and unexpected weight change.  HENT: Positive for sore throat. Negative for congestion, dental problem, drooling, ear discharge, ear pain, facial swelling, hearing loss, mouth sores, nosebleeds, postnasal drip, rhinorrhea, sinus pressure, sinus pain, sneezing, tinnitus, trouble swallowing and voice change.   Eyes: Negative for photophobia, pain, discharge, redness, itching and visual disturbance.  Respiratory: Negative for cough, choking, chest tightness, shortness of breath, wheezing and stridor.   Cardiovascular: Negative for chest pain, palpitations and leg swelling.  Gastrointestinal: Negative for abdominal distention, abdominal pain, blood in stool, diarrhea, nausea and vomiting.  Endocrine: Negative for cold intolerance and heat intolerance.  Genitourinary: Negative for dysuria.  Musculoskeletal: Negative for arthralgias, back pain, gait problem, joint swelling, myalgias, neck pain and neck stiffness.  Skin: Negative for color change,  pallor, rash and wound.  Allergic/Immunologic: Positive for environmental allergies. Negative for food allergies and immunocompromised state.  Neurological: Negative for dizziness, tremors, seizures, syncope, facial asymmetry, speech difficulty, weakness, light-headedness, numbness and headaches.  Hematological: Negative for adenopathy. Does not bruise/bleed easily.  Psychiatric/Behavioral: Negative for agitation, confusion and sleep disturbance.       Objective:   Physical Exam  Constitutional: He is oriented to person, place, and time. Vital signs are normal. He appears well-developed and well-nourished. He is active and cooperative.  Non-toxic appearance. He does not have a sickly appearance. He appears ill. No distress.  HENT:  Head: Normocephalic and atraumatic.  Right Ear: Hearing, external ear and ear canal normal. A middle ear effusion is present.  Left Ear: Hearing, external ear and ear canal normal. A middle ear effusion is present.  Nose: Mucosal edema and rhinorrhea present. No nose lacerations, sinus tenderness, nasal deformity, septal deviation or nasal septal hematoma. No epistaxis.  No foreign bodies. Right sinus exhibits no maxillary sinus tenderness and no frontal sinus tenderness. Left sinus exhibits no maxillary sinus tenderness and no frontal sinus tenderness.  Mouth/Throat: Uvula is midline and mucous membranes are normal. Mucous membranes are not pale, not dry and not cyanotic. He does not have dentures. No oral lesions. No trismus in the jaw. Normal dentition. No dental abscesses, uvula swelling, lacerations or dental caries. Posterior oropharyngeal edema and posterior oropharyngeal erythema present. No oropharyngeal exudate or tonsillar abscesses. Tonsils are 1+ on the right. Tonsils are 1+ on the left. No tonsillar exudate.  Cobblestoning posterior pharynx; bilateral TMs air fluid level 50% opacity right greater than left; bilateral allergic shiners; bilateral nasal  turbinates edema erythema clear discharge; oropharynx erythema macular  Eyes: Pupils are equal, round, and reactive to light. Conjunctivae, EOM and lids are  normal. Right eye exhibits no chemosis, no discharge, no exudate and no hordeolum. No foreign body present in the right eye. Left eye exhibits no chemosis, no discharge, no exudate and no hordeolum. No foreign body present in the left eye. Right conjunctiva is not injected. Right conjunctiva has no hemorrhage. Left conjunctiva is not injected. Left conjunctiva has no hemorrhage. No scleral icterus. Right eye exhibits normal extraocular motion and no nystagmus. Left eye exhibits normal extraocular motion and no nystagmus. Right pupil is round and reactive. Left pupil is round and reactive. Pupils are equal.  Neck: Trachea normal, normal range of motion and phonation normal. Neck supple. No tracheal tenderness, no spinous process tenderness and no muscular tenderness present. No neck rigidity. No tracheal deviation, no edema, no erythema and normal range of motion present. No thyroid mass and no thyromegaly present.  Cardiovascular: Normal rate, regular rhythm, S1 normal, S2 normal, normal heart sounds and intact distal pulses. PMI is not displaced. Exam reveals no gallop, no distant heart sounds and no friction rub.  No murmur heard. Pulses:      Radial pulses are 2+ on the right side, and 2+ on the left side.  Pulmonary/Chest: Effort normal and breath sounds normal. No stridor. No respiratory distress. He has no decreased breath sounds. He has no wheezes. He has no rhonchi. He has no rales.  No cough observed in exam room; spoke full sentences without difficulty  Abdominal: Soft. Normal appearance. He exhibits no distension and no ascites. There is no rigidity and no guarding.  Musculoskeletal: Normal range of motion. He exhibits no edema or tenderness.       Right shoulder: Normal.       Left shoulder: Normal.       Right elbow: Normal.      Left  elbow: Normal.       Right hip: Normal.       Left hip: Normal.       Right knee: Normal.       Left knee: Normal.       Cervical back: Normal.       Thoracic back: Normal.       Lumbar back: Normal.       Right hand: Normal.       Left hand: Normal.  Lymphadenopathy:       Head (right side): No submental, no submandibular, no tonsillar, no preauricular, no posterior auricular and no occipital adenopathy present.       Head (left side): No submental, no submandibular, no tonsillar, no preauricular, no posterior auricular and no occipital adenopathy present.    He has no cervical adenopathy.       Right cervical: No superficial cervical, no deep cervical and no posterior cervical adenopathy present.      Left cervical: No superficial cervical, no deep cervical and no posterior cervical adenopathy present.  Neurological: He is alert and oriented to person, place, and time. He is not disoriented. He displays no atrophy and no tremor. No cranial nerve deficit or sensory deficit. He exhibits normal muscle tone. He displays no seizure activity. Coordination and gait normal. GCS eye subscore is 4. GCS verbal subscore is 5. GCS motor subscore is 6.  On/off exam table; in/out of chair without difficulty; gait sure and steady in hallway  Skin: Skin is warm, dry and intact. Capillary refill takes less than 2 seconds. No abrasion, no bruising, no burn, no ecchymosis, no laceration, no lesion, no petechiae and no rash noted. He  is not diaphoretic. No cyanosis or erythema. No pallor. Nails show no clubbing.  Psychiatric: He has a normal mood and affect. His speech is normal and behavior is normal. Judgment and thought content normal. He is not actively hallucinating. Cognition and memory are normal. He is attentive.  Nursing note and vitals reviewed.         Assessment & Plan:  A-viral URI, otitis media effusion bilaterally  P-phenylephrine 10mg  po qid prn rhinitis given 8 UD from clinic stock,  Patient may use normal saline nasal spray 2 sprays each nostril q2h wa as needed given one bottle from clinic stock; renewed Rx electronic to his pharmacy of choice flonase 60mcg 1 spray each nostril BID #1 RF6.  Tylenol 1000mg  po QID prn pain #6 UD given from clinic stock;   Patient denied personal or family history of ENT cancer.  OTC antihistamine of choice claritin 10mg  po daily.  Avoid triggers if possible.  Shower prior to bedtime if exposed to triggers.  If allergic dust/dust mites recommend mattress/pillow covers/encasements; washing linens, vacuuming, sweeping, dusting weekly.  Call or return to clinic as needed if these symptoms worsen or fail to improve as anticipated.   Exitcare handout on viral URI and sinus rinse given to patient.  Patient verbalized understanding of instructions, agreed with plan of care and had no further questions at this time.   Discussed post nasal drip has irritated throat and causes eustachian tube dysfunction/fluid in middle ear.  Supportive treatment.   No evidence of invasive bacterial infection, non toxic and well hydrated.  This is most likely self limiting viral infection.  I do not see where any further testing or imaging is necessary at this time.   I will suggest supportive care, rest, good hygiene and encourage the patient to take adequate fluids.  The patient is to return to clinic or EMERGENCY ROOM if symptoms worsen or change significantly e.g. ear pain, fever, purulent discharge from ears or bleeding.  Exitcare handout on otitis media with effusion given to patient.  Patient verbalized agreement and understanding of treatment plan.    May continue chloraseptic spray prn per manufacturers instructions.  Discussed salty soups, nondairy smoothies/popsicles can help sore throat along with honey with lemon.  probable post nasal drip from nonallergic rhinitis causing sore throat but if no improvement with nasal saline, loratadine 10mg  po daily, phenylephrine 10mg  po  q6h prn rhinitis and flonase 1 spray each nostril BID follow 48 hours and will consider amoxicillin 875mg  po BID #20 RF0 to cover for strep throat.  Strep throat testing not available in this clinic.  Usually no specific medical treatment is needed if a virus is causing the sore throat. The throat most often gets better on its own within 5 to 7 days. Antibiotic medicine does not cure viral pharyngitis.  For acute pharyngitis caused by bacteria, your healthcare provider will prescribe an antibiotic.  Marland Kitchen Do not smoke.  Marland Kitchen Avoid secondhand smoke and other air pollutants.  . Use a cool mist humidifier to add moisture to the air.  . Get plenty of rest.  . You may want to rest your throat by talking less and eating a diet that is mostly liquid or soft for a day or two.  Marland Kitchen Nonprescription throat lozenges and mouthwashes should help relieve the soreness.  . Gargling with warm saltwater and drinking warm liquids may help. (You can make a saltwater solution by adding 1/4 teaspoon of salt to 8 ounces, or 240 mL, of  warm water.)  . A nonprescription pain reliever such as aspirin, acetaminophen, or ibuprofen may ease general aches and pains.  FOLLOW UP with clinic provider if no improvements in the next 7-10 days. Patient verbalized understanding of instructions and agreed with plan of care.  P2: Hand washing and diet.   Elevated TSH, slight anemia, elevated lipids/LDH patient has not followed up with PCM needs to schedule male exec panel with CBC/HgbA1c fasting draw with RN Hildred Alamin.  He stated he would do so next week.  Viral upper respiratory infection: no evidence of invasive bacterial infection, non toxic and well hydrated. This is most likely self limiting viral infection. I do not see where any further testing or imaging is necessary at this time. I will suggest supportive care, rest, good hygiene and encourage the patient to take adequate fluids. The patient is to return to clinic or EMERGENCY ROOM if symptoms  worsen or change significantly e.g. fever, lethargy, SOB, wheezing or no improvement in symptoms with plan of care. Exitcare handout on viral illness given to patient. Patient verbalized agreement and understanding of treatment plan.  P2: Hand washing and cover cough

## 2018-01-29 ENCOUNTER — Encounter: Payer: Self-pay | Admitting: Registered Nurse

## 2018-02-28 ENCOUNTER — Ambulatory Visit: Payer: Self-pay | Admitting: *Deleted

## 2018-02-28 DIAGNOSIS — Z0000 Encounter for general adult medical examination without abnormal findings: Secondary | ICD-10-CM

## 2018-02-28 NOTE — Progress Notes (Signed)
Fasting labs drawn per orders from 01/28/18 clinic visit with NP. Abnormal [facility] labs in July 2018 and has not followed up with pcp. No standing order [facility] labs being drawn as part of insurance premium discount program this year due to short plan year and plan changes.

## 2018-03-01 LAB — CMP12+LP+TP+TSH+6AC+PSA+CBC…
A/G RATIO: 2.6 — AB (ref 1.2–2.2)
ALBUMIN: 4.9 g/dL — AB (ref 3.6–4.8)
ALT: 18 IU/L (ref 0–44)
AST: 18 IU/L (ref 0–40)
Alkaline Phosphatase: 60 IU/L (ref 39–117)
BILIRUBIN TOTAL: 1.4 mg/dL — AB (ref 0.0–1.2)
BUN/Creatinine Ratio: 13 (ref 10–24)
BUN: 15 mg/dL (ref 8–27)
Basophils Absolute: 0 10*3/uL (ref 0.0–0.2)
Basos: 0 %
CHOLESTEROL TOTAL: 188 mg/dL (ref 100–199)
Calcium: 9.6 mg/dL (ref 8.6–10.2)
Chloride: 104 mmol/L (ref 96–106)
Chol/HDL Ratio: 5.9 ratio — ABNORMAL HIGH (ref 0.0–5.0)
Creatinine, Ser: 1.12 mg/dL (ref 0.76–1.27)
EOS (ABSOLUTE): 0 10*3/uL (ref 0.0–0.4)
ESTIMATED CHD RISK: 1.2 times avg. — AB (ref 0.0–1.0)
Eos: 1 %
FREE THYROXINE INDEX: 1.7 (ref 1.2–4.9)
GFR calc Af Amer: 82 mL/min/{1.73_m2} (ref >59)
GFR calc non Af Amer: 71 mL/min/{1.73_m2} (ref >59)
GGT: 23 IU/L (ref 0–65)
Globulin, Total: 1.9 g/dL (ref 1.5–4.5)
Glucose: 91 mg/dL (ref 65–99)
HDL: 32 mg/dL — AB (ref >39)
Hematocrit: 39.2 % (ref 37.5–51.0)
Hemoglobin: 12.7 g/dL — ABNORMAL LOW (ref 13.0–17.7)
IRON: 96 ug/dL (ref 38–169)
Immature Grans (Abs): 0.2 10*3/uL — ABNORMAL HIGH (ref 0.0–0.1)
Immature Granulocytes: 4 %
LDH: 461 IU/L — ABNORMAL HIGH (ref 121–224)
LDL Calculated: 122 mg/dL — ABNORMAL HIGH (ref 0–99)
LYMPHS ABS: 1.8 10*3/uL (ref 0.7–3.1)
Lymphs: 26 %
MCH: 30.2 pg (ref 26.6–33.0)
MCHC: 32.4 g/dL (ref 31.5–35.7)
MCV: 93 fL (ref 79–97)
MONOCYTES: 4 %
MONOS ABS: 0.3 10*3/uL (ref 0.1–0.9)
NEUTROS ABS: 4.6 10*3/uL (ref 1.4–7.0)
Neutrophils: 65 %
PLATELETS: 261 10*3/uL (ref 150–450)
Phosphorus: 3.8 mg/dL (ref 2.5–4.5)
Potassium: 4.5 mmol/L (ref 3.5–5.2)
Prostate Specific Ag, Serum: 0.3 ng/mL (ref 0.0–4.0)
RBC: 4.2 x10E6/uL (ref 4.14–5.80)
RDW: 17.1 % — AB (ref 12.3–15.4)
Sodium: 142 mmol/L (ref 134–144)
T3 UPTAKE RATIO: 24 % (ref 24–39)
T4, Total: 7.2 ug/dL (ref 4.5–12.0)
TOTAL PROTEIN: 6.8 g/dL (ref 6.0–8.5)
TSH: 5.6 u[IU]/mL — ABNORMAL HIGH (ref 0.450–4.500)
Triglycerides: 171 mg/dL — ABNORMAL HIGH (ref 0–149)
Uric Acid: 7.8 mg/dL (ref 3.7–8.6)
VLDL CHOLESTEROL CAL: 34 mg/dL (ref 5–40)
WBC: 6.7 10*3/uL (ref 3.4–10.8)

## 2018-03-01 LAB — HGB A1C W/O EAG: HEMOGLOBIN A1C: 5.7 % — AB (ref 4.8–5.6)

## 2018-03-06 NOTE — Progress Notes (Signed)
noted 

## 2018-03-06 NOTE — Progress Notes (Signed)
Late entry: Results reviewed with pt 03/03/18. Fasting glucose improved from previous. A1c remains stable, elevated as previous. Bilirubin increased, LDH slightly improved from previous. Triglycerides worsened with HDL stable, low and LDL stable, elevated.   TSH increased from previous. Pt denies overt hypothyroid-type sx such as cold intolerance, dry skin, brittle nails, constipation, weight gain, etc. Fatigue + but attributes to age, stress, schedule, etc. Pt denies etoh intake.    Hgb slightly low stable and RDW increased stable. Pt reports protein with at least 2 meals per day. Not taking multivitamin.    Discussed diet and exercise recommendations re: A1c, elevated LDL/trigs, low HDL, weight management. Reminded pt that labs were repeated because they were abnormal last year and he had not followed up with pcp. He reports no f/u currently scheduled yet with pcp but plans to soon since labs not improved and sts his wife is pushing him to see pcp so he will likely end up scheduled by her. Copy of labs provided to pt. Results routed to pcp per pt request. No further questions/concerns.

## 2018-10-20 ENCOUNTER — Ambulatory Visit: Payer: Self-pay | Admitting: *Deleted

## 2018-10-20 VITALS — BP 114/76 | HR 55 | Ht 71.0 in | Wt 175.0 lb

## 2018-10-20 DIAGNOSIS — Z0000 Encounter for general adult medical examination without abnormal findings: Secondary | ICD-10-CM

## 2018-10-20 NOTE — Progress Notes (Signed)
[facility] insurance premium discount evaluation: Labs Drawn. Replacements ROI form signed. Tobacco Free Attestation form signed.  Forms placed in paper chart. Pt requests not to send results to listed pcp as he is planning to change providers. Does not know who he will be switching to. Advised pt to inform RN once known, and results can be sent there when needed.

## 2018-10-21 LAB — CMP12+LP+TP+TSH+6AC+PSA+CBC…
A/G RATIO: 2.4 — AB (ref 1.2–2.2)
ALT: 23 IU/L (ref 0–44)
AST: 27 IU/L (ref 0–40)
Albumin: 4.7 g/dL (ref 3.8–4.8)
Alkaline Phosphatase: 57 IU/L (ref 39–117)
BILIRUBIN TOTAL: 1.4 mg/dL — AB (ref 0.0–1.2)
BUN/Creatinine Ratio: 19 (ref 10–24)
BUN: 21 mg/dL (ref 8–27)
Basophils Absolute: 0.1 10*3/uL (ref 0.0–0.2)
Basos: 1 %
CHOL/HDL RATIO: 5.5 ratio — AB (ref 0.0–5.0)
CREATININE: 1.09 mg/dL (ref 0.76–1.27)
Calcium: 9.3 mg/dL (ref 8.6–10.2)
Chloride: 105 mmol/L (ref 96–106)
Cholesterol, Total: 169 mg/dL (ref 100–199)
EOS (ABSOLUTE): 0.1 10*3/uL (ref 0.0–0.4)
Eos: 1 %
Estimated CHD Risk: 1.1 times avg. — ABNORMAL HIGH (ref 0.0–1.0)
Free Thyroxine Index: 1.8 (ref 1.2–4.9)
GFR calc Af Amer: 84 mL/min/{1.73_m2} (ref >59)
GFR, EST NON AFRICAN AMERICAN: 73 mL/min/{1.73_m2} (ref >59)
GGT: 23 IU/L (ref 0–65)
GLOBULIN, TOTAL: 2 g/dL (ref 1.5–4.5)
GLUCOSE: 89 mg/dL (ref 65–99)
HDL: 31 mg/dL — AB (ref >39)
HEMATOCRIT: 38.4 % (ref 37.5–51.0)
Hemoglobin: 13.1 g/dL (ref 13.0–17.7)
Iron: 106 ug/dL (ref 38–169)
LDH: 551 IU/L — AB (ref 121–224)
LDL Calculated: 109 mg/dL — ABNORMAL HIGH (ref 0–99)
LYMPHS: 28 %
Lymphocytes Absolute: 1.7 10*3/uL (ref 0.7–3.1)
MCH: 31.9 pg (ref 26.6–33.0)
MCHC: 34.1 g/dL (ref 31.5–35.7)
MCV: 93 fL (ref 79–97)
MONOCYTES: 5 %
Monocytes Absolute: 0.3 10*3/uL (ref 0.1–0.9)
NRBC: 1 % — AB (ref 0–0)
Neutrophils Absolute: 3.8 10*3/uL (ref 1.4–7.0)
Neutrophils: 62 %
PROSTATE SPECIFIC AG, SERUM: 0.3 ng/mL (ref 0.0–4.0)
Phosphorus: 3.8 mg/dL (ref 2.8–4.1)
Platelets: 235 10*3/uL (ref 150–450)
Potassium: 4.5 mmol/L (ref 3.5–5.2)
RBC: 4.11 x10E6/uL — AB (ref 4.14–5.80)
RDW: 16.1 % — AB (ref 11.6–15.4)
Sodium: 141 mmol/L (ref 134–144)
T3 UPTAKE RATIO: 26 % (ref 24–39)
T4, Total: 6.9 ug/dL (ref 4.5–12.0)
TRIGLYCERIDES: 145 mg/dL (ref 0–149)
TSH: 3.08 u[IU]/mL (ref 0.450–4.500)
Total Protein: 6.7 g/dL (ref 6.0–8.5)
URIC ACID: 7.8 mg/dL (ref 3.7–8.6)
VLDL CHOLESTEROL CAL: 29 mg/dL (ref 5–40)
WBC: 6.2 10*3/uL (ref 3.4–10.8)

## 2018-10-21 LAB — HGB A1C W/O EAG: Hgb A1c MFr Bld: 5.6 % (ref 4.8–5.6)

## 2018-10-21 LAB — IMMATURE CELLS: Metamyelocytes: 3 % — ABNORMAL HIGH (ref 0–0)

## 2018-10-24 NOTE — Progress Notes (Signed)
Results reviewed with pt in clinic 10/21/2018. Pt denies any recent trauma, blood loss, blood donation. Does have Hx Hep B. Seen by ID in 2017 but ordered labs were never completed. Pricing list to be reviewed to determine ability to complete testing through [facility]. Advised pt that labs will be repeated within the next week to verify resulted levels. Further plan of care to be made after new results available. Appt made for next week as pt is a difficult stick and advised him to hydrate well prior to appointment.

## 2018-10-28 NOTE — Progress Notes (Signed)
Pt scheduled for redraw later this week. Plan to recheck CBC and additional hepatitis labs that were not completed when pt saw ID 2017. Per [facility] test menu, Hepatitis Panel, Acute #[id number] includes Hepatitis A antibody, IgM; hepatitis B core antibody, IgM; hepatitis B surface antigen; hepatitis C virus antibody. Please advise on any additional testing that may be indicated.

## 2018-10-30 ENCOUNTER — Ambulatory Visit: Payer: Self-pay | Admitting: *Deleted

## 2018-10-30 DIAGNOSIS — Z8619 Personal history of other infectious and parasitic diseases: Secondary | ICD-10-CM

## 2018-10-30 NOTE — Progress Notes (Signed)
noted 

## 2018-10-30 NOTE — Progress Notes (Signed)
Ordered labs drawn today 10/30/18. Expect results by Monday or Tuesday 2/10-11.

## 2018-10-30 NOTE — Progress Notes (Signed)
Hepatitis panel and CBC drawn.

## 2018-10-31 LAB — HEPATITIS PANEL, ACUTE
HEP B S AG: NEGATIVE
Hep A IgM: NEGATIVE
Hep B C IgM: NEGATIVE

## 2018-10-31 LAB — IMMATURE CELLS: METAMYELOCYTES: 2 % — AB (ref 0–0)

## 2018-10-31 LAB — CBC WITH DIFFERENTIAL/PLATELET
Basophils Absolute: 0 10*3/uL (ref 0.0–0.2)
Basos: 0 %
EOS (ABSOLUTE): 0.1 10*3/uL (ref 0.0–0.4)
EOS: 1 %
HEMOGLOBIN: 12.3 g/dL — AB (ref 13.0–17.7)
Hematocrit: 37.7 % (ref 37.5–51.0)
LYMPHS ABS: 1.5 10*3/uL (ref 0.7–3.1)
Lymphs: 24 %
MCH: 30.5 pg (ref 26.6–33.0)
MCHC: 32.6 g/dL (ref 31.5–35.7)
MCV: 94 fL (ref 79–97)
Monocytes Absolute: 0.3 10*3/uL (ref 0.1–0.9)
Monocytes: 4 %
NEUTROS ABS: 4.3 10*3/uL (ref 1.4–7.0)
NRBC: 1 % — ABNORMAL HIGH (ref 0–0)
Neutrophils: 69 %
PLATELETS: 251 10*3/uL (ref 150–450)
RBC: 4.03 x10E6/uL — AB (ref 4.14–5.80)
RDW: 16.2 % — ABNORMAL HIGH (ref 11.6–15.4)
WBC: 6.3 10*3/uL (ref 3.4–10.8)

## 2018-11-13 NOTE — Progress Notes (Signed)
Followed up with pt regarding heme/onc referral. He reports he is not quite finished looking through the list of providers, but will try to be ready next week.

## 2018-11-14 NOTE — Progress Notes (Signed)
Noted will follow up with patient in one week if he has not contacted [place] sooner

## 2018-11-25 ENCOUNTER — Encounter: Payer: Self-pay | Admitting: Registered Nurse

## 2018-11-25 ENCOUNTER — Telehealth: Payer: Self-pay | Admitting: Registered Nurse

## 2018-11-25 DIAGNOSIS — R7989 Other specified abnormal findings of blood chemistry: Secondary | ICD-10-CM

## 2018-11-25 NOTE — Progress Notes (Signed)
Noted and agreed referral sent electronically signed

## 2018-11-25 NOTE — Progress Notes (Signed)
Pt reports no preference on provider except [facility] in [place]. Referral completed by NP.

## 2018-11-25 NOTE — Telephone Encounter (Signed)
Patient wanted to review available hematology/oncology providers in his insurance network  And he wants to see [facility] [phone] phone location [place], [place].  +NRBCs on CBC x 2 10/20/2018 and 10/30/2018  PMHx Hepatitis B and hospitalized as child in [place] for blood disorder per patient.  Referral placed electronically today.  Patient to contact office if they do not contact him to schedule appt.  Patient verbalized understanding information/instructions, agreed with plan of care and had no further questions at this time.

## 2018-12-08 NOTE — Telephone Encounter (Signed)
Pt has pending appt scheduled with Dr. Alen Blew at [facility] 12/24/2018 at 1400.

## 2018-12-09 NOTE — Telephone Encounter (Signed)
noted 

## 2018-12-16 ENCOUNTER — Telehealth: Payer: Self-pay | Admitting: Oncology

## 2018-12-16 NOTE — Telephone Encounter (Signed)
Pt cld to cancel appt d/t coronavirus. Chosen not to r/s at this time

## 2018-12-24 ENCOUNTER — Encounter: Payer: Self-pay | Admitting: Oncology

## 2019-01-01 ENCOUNTER — Ambulatory Visit: Payer: Self-pay | Admitting: *Deleted

## 2019-01-01 ENCOUNTER — Other Ambulatory Visit: Payer: Self-pay

## 2019-01-01 VITALS — BP 119/72 | HR 67

## 2019-01-01 DIAGNOSIS — R7989 Other specified abnormal findings of blood chemistry: Secondary | ICD-10-CM

## 2019-01-01 NOTE — Progress Notes (Signed)
Repeat CBC draw as pt cancelled hematology referral appt due to COVID-19 concerns. Last draw 2 months ago and RBC & Hgb had decreased in the interim month between then and first draw. Pt denies fatigue, ShOB, blood in stools or urine, etc. Does endorse some nosebleeds which he sts he forgot to mention during previous appts. But these are induced due to position changes or pressure to nose due to his anatomy per ENT telling him he has a blood vessel close to surface of skin that makes it more prone to easily bleeding. He sts these are minimal, occurring only a couple of times a month and are only a slow leak of blood for a few seconds. Easily stopped with minimal pressure to area.   Advised pt after results return, he could call the Oncology center to discuss virtual visit options to at least complete a consultation, especially if lab values are worsening. He is agreeable to this.

## 2019-01-02 LAB — CBC WITH DIFFERENTIAL/PLATELET
Basophils Absolute: 0.1 10*3/uL (ref 0.0–0.2)
Basos: 1 %
EOS (ABSOLUTE): 0 10*3/uL (ref 0.0–0.4)
Eos: 0 %
Hematocrit: 34.5 % — ABNORMAL LOW (ref 37.5–51.0)
Hemoglobin: 11.8 g/dL — ABNORMAL LOW (ref 13.0–17.7)
Lymphocytes Absolute: 0.9 10*3/uL (ref 0.7–3.1)
Lymphs: 17 %
MCH: 31.6 pg (ref 26.6–33.0)
MCHC: 34.2 g/dL (ref 31.5–35.7)
MCV: 92 fL (ref 79–97)
Monocytes Absolute: 0.1 10*3/uL (ref 0.1–0.9)
Monocytes: 1 %
NRBC: 1 % — ABNORMAL HIGH (ref 0–0)
Neutrophils Absolute: 3.9 10*3/uL (ref 1.4–7.0)
Neutrophils: 78 %
Platelets: 233 10*3/uL (ref 150–450)
RBC: 3.74 x10E6/uL — ABNORMAL LOW (ref 4.14–5.80)
RDW: 15.9 % — ABNORMAL HIGH (ref 11.6–15.4)
WBC: 5 10*3/uL (ref 3.4–10.8)

## 2019-01-02 LAB — IMMATURE CELLS
MYELOCYTES: 1 % — ABNORMAL HIGH (ref 0–0)
Metamyelocytes: 2 % — ABNORMAL HIGH (ref 0–0)

## 2019-01-03 NOTE — Progress Notes (Signed)
noted 

## 2019-01-05 ENCOUNTER — Telehealth: Payer: Self-pay | Admitting: Oncology

## 2019-01-05 NOTE — Telephone Encounter (Signed)
Received a call from the referring office to schedule a hem appt for Mr. Andrew Mccoy. 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 Alen Blew in [place], but cancelled d/t COVID-19. Pt has been rescheduled to see Dr. Alen Blew on 4/21 at 11am.

## 2019-01-06 NOTE — Progress Notes (Signed)
noted 

## 2019-01-13 ENCOUNTER — Inpatient Hospital Stay: Payer: PRIVATE HEALTH INSURANCE | Attending: Oncology | Admitting: Oncology

## 2019-01-13 ENCOUNTER — Other Ambulatory Visit: Payer: Self-pay

## 2019-01-13 VITALS — BP 128/84 | HR 65 | Temp 97.7°F | Resp 17 | Ht 71.0 in | Wt 181.2 lb

## 2019-01-13 DIAGNOSIS — D649 Anemia, unspecified: Secondary | ICD-10-CM | POA: Diagnosis not present

## 2019-01-13 NOTE — Progress Notes (Signed)
Reason for the request:    Anemia  HPI: I was asked by Gerarda Fraction, NP   to evaluate Andrew Mccoy for mild anemia.  He is a 63 year old rather healthy gentleman without any significant comorbid conditions was noted to have mild anemia on routine health screening.  In January 2020 his hemoglobin was 13 with elevated RDW and normal MCV.  He had a nucleated red cell noted in his peripheral smear.  Repeat CBC obtained on February 6 of 2020 showed a hemoglobin of 12.3 with RDW of 16.2 and normal white cell count and platelets.  CBC obtained on April 9 of 2020 showed a white cell count of 5.0, hemoglobin 11.8 and platelet count of 233.  He had 1 nucleated red cell but otherwise normal differential.  He had 1 myelocyte noted in the peripheral smear.  Based on these findings he was referred to me for evaluation.  Clinically, he is completely asymptomatic.  He denies any fevers, chills or sweats.  He denies any constitutional symptoms or pruritus.  His performance status and quality of life remain excellent.  He does not report any headaches, blurry vision, syncope or seizures. Does not report any fevers, chills or sweats.  Does not report any cough, wheezing or hemoptysis.  Does not report any chest pain, palpitation, orthopnea or leg edema.  Does not report any nausea, vomiting or abdominal pain.  Does not report any constipation or diarrhea.  Does not report any skeletal complaints.    Does not report frequency, urgency or hematuria.  Does not report any skin rashes or lesions. Does not report any heat or cold intolerance.  Does not report any lymphadenopathy or petechiae.  Does not report any anxiety or depression.  Remaining review of systems is negative.    Past Medical History:  Diagnosis Date  . Hepatitis B   :  No past surgical history on file.:   Current Outpatient Medications:  .  sodium chloride (OCEAN) 0.65 % SOLN nasal spray, Place 2 sprays into both nostrils every 2 (two) hours while  awake., Disp: , Rfl: 0:  No Known Allergies:  Family History  Problem Relation Age of Onset  . Heart disease Mother   . Heart disease Father   :  Social History   Socioeconomic History  . Marital status: Married    Spouse name: Not on file  . Number of children: 1  . Years of education: Not on file  . Highest education level: Not on file  Occupational History  . Not on file  Social Needs  . Financial resource strain: Not on file  . Food insecurity:    Worry: Not on file    Inability: Not on file  . Transportation needs:    Medical: Not on file    Non-medical: Not on file  Tobacco Use  . Smoking status: Never Smoker  . Smokeless tobacco: Never Used  Substance and Sexual Activity  . Alcohol use: No    Alcohol/week: 0.0 standard drinks  . Drug use: No  . Sexual activity: Not on file  Lifestyle  . Physical activity:    Days per week: Not on file    Minutes per session: Not on file  . Stress: Not on file  Relationships  . Social connections:    Talks on phone: Not on file    Gets together: Not on file    Attends religious service: Not on file    Active member of club or organization: Not on  file    Attends meetings of clubs or organizations: Not on file    Relationship status: Not on file  . Intimate partner violence:    Fear of current or ex partner: Not on file    Emotionally abused: Not on file    Physically abused: Not on file    Forced sexual activity: Not on file  Other Topics Concern  . Not on file  Social History Narrative  . Not on file  :  Pertinent items are noted in HPI.  Exam: Blood pressure 128/84, pulse 65, temperature 97.7 F (36.5 C), temperature source Oral, resp. rate 17, height _0  (1.803 m), weight 181 lb 3.2 oz (82.2 kg), SpO2 98 %.  ECOG 0 General appearance: alert and cooperative appeared without distress. Head: atraumatic without any abnormalities. Eyes: conjunctivae/corneas clear. PERRL.  Sclera anicteric. Throat: lips,  mucosa, and tongue normal; without oral thrush or ulcers. Resp: clear to auscultation bilaterally without rhonchi, wheezes or dullness to percussion. Cardio: regular rate and rhythm, S1, S2 normal, no murmur, click, rub or gallop GI: soft, non-tender; bowel sounds normal; no masses,  no organomegaly Skin: Skin color, texture, turgor normal. No rashes or lesions Lymph nodes: Cervical, supraclavicular, and axillary nodes normal. Neurologic: Grossly normal without any motor, sensory or deep tendon reflexes. Musculoskeletal: No joint deformity or effusion.  CBC    Component Value Date/Time   WBC 5.0 01/01/2019 1405   WBC 7.6 01/08/2007 1132   RBC 3.74 (L) 01/01/2019 1405   RBC 5.06 01/08/2007 1132   HGB 11.8 (L) 01/01/2019 1405   HCT 34.5 (L) 01/01/2019 1405   PLT 233 01/01/2019 1405   MCV 92 01/01/2019 1405   MCH 31.6 01/01/2019 1405   MCHC 34.2 01/01/2019 1405   MCHC 34.7 01/08/2007 1132   RDW 15.9 (H) 01/01/2019 1405   LYMPHSABS 0.9 01/01/2019 1405   MONOABS 0.4 01/08/2007 1132   EOSABS 0.0 01/01/2019 1405   BASOSABS 0.1 01/01/2019 1405     Assessment and Plan:   63 year old man with the following:  1.  Anemia with nucleated red cells in the peripheral smear.  The differential diagnosis was discussed today with the patient as well as management strategies.  His hemoglobin remains close to normal range although slightly declining.  He has normal white cell count and platelet count with normal differential.  Possibility to explain his nucleated red cells could be related to a myeloproliferative disorder such as myelofibrosis among others.  Extramedullary hematopoiesis could also be a possibility.  He does not have any splenomegaly on exam nor any constitutional symptoms.  From a management standpoint, I have recommended close observation in the immediate future.  I will repeat his CBC, iron study as well as a peripheral smear.  I will also obtain a myeloproliferative disorder  profile.  Bone marrow biopsy was also discussed which is a possibility if these abnormalities persist.  Complications and risks and benefits associated with this procedure was reviewed today and he is willing to proceed if needed to in the future.  2.  Follow-up will be in the next few months for repeat laboratory testing and evaluation.  40  minutes was spent with the patient face-to-face today.  More than 50% of time was dedicated to reviewing laboratory data, discussing differential diagnosis and answering questions regarding future plan of care.   Thank you for the referral.  A copy of this consult has been forwarded to the requesting physician.

## 2019-01-13 NOTE — Progress Notes (Signed)
noted 

## 2019-01-14 ENCOUNTER — Telehealth: Payer: Self-pay | Admitting: Oncology

## 2019-01-14 NOTE — Telephone Encounter (Signed)
Scheduled appt per 4/21 sch message. °

## 2019-04-14 ENCOUNTER — Encounter: Payer: Self-pay | Admitting: Oncology

## 2019-04-14 ENCOUNTER — Other Ambulatory Visit: Payer: Self-pay

## 2019-04-14 ENCOUNTER — Inpatient Hospital Stay: Payer: PRIVATE HEALTH INSURANCE | Attending: Oncology

## 2019-04-14 DIAGNOSIS — D649 Anemia, unspecified: Secondary | ICD-10-CM | POA: Diagnosis not present

## 2019-04-14 DIAGNOSIS — C946 Myelodysplastic disease, not classified: Secondary | ICD-10-CM | POA: Insufficient documentation

## 2019-04-14 LAB — CMP (CANCER CENTER ONLY)
ALT: 26 U/L (ref 0–44)
AST: 22 U/L (ref 15–41)
Albumin: 4.4 g/dL (ref 3.5–5.0)
Alkaline Phosphatase: 53 U/L (ref 38–126)
Anion gap: 11 (ref 5–15)
BUN: 15 mg/dL (ref 8–23)
CO2: 22 mmol/L (ref 22–32)
Calcium: 9.4 mg/dL (ref 8.9–10.3)
Chloride: 107 mmol/L (ref 98–111)
Creatinine: 1.07 mg/dL (ref 0.61–1.24)
GFR, Est AFR Am: >60 mL/min (ref >60)
GFR, Estimated: >60 mL/min (ref >60)
Glucose, Bld: 113 mg/dL — ABNORMAL HIGH (ref 70–99)
Potassium: 4.1 mmol/L (ref 3.5–5.1)
Sodium: 140 mmol/L (ref 135–145)
Total Bilirubin: 1.1 mg/dL (ref 0.3–1.2)
Total Protein: 6.8 g/dL (ref 6.5–8.1)

## 2019-04-14 LAB — CBC WITH DIFFERENTIAL (CANCER CENTER ONLY)
Abs Immature Granulocytes: 0 10*3/uL (ref 0.00–0.07)
Band Neutrophils: 1 %
Basophils Absolute: 0.1 10*3/uL (ref 0.0–0.1)
Basophils Relative: 1 %
Blasts: 1 %
Eosinophils Absolute: 0 10*3/uL (ref 0.0–0.5)
Eosinophils Relative: 0 %
HCT: 36.9 % — ABNORMAL LOW (ref 39.0–52.0)
Hemoglobin: 12.2 g/dL — ABNORMAL LOW (ref 13.0–17.0)
Lymphocytes Relative: 21 %
Lymphs Abs: 1.4 10*3/uL (ref 0.7–4.0)
MCH: 31.4 pg (ref 26.0–34.0)
MCHC: 33.1 g/dL (ref 30.0–36.0)
MCV: 95.1 fL (ref 80.0–100.0)
Monocytes Absolute: 0.3 10*3/uL (ref 0.1–1.0)
Monocytes Relative: 4 %
Neutro Abs: 4.8 10*3/uL (ref 1.7–17.7)
Neutrophils Relative %: 72 %
Platelet Count: 240 10*3/uL (ref 150–400)
RBC: 3.88 MIL/uL — ABNORMAL LOW (ref 4.22–5.81)
RDW: 16 % — ABNORMAL HIGH (ref 11.5–15.5)
WBC Count: 6.6 10*3/uL (ref 4.0–10.5)
WBC Morphology: 3
nRBC: 0.9 % — ABNORMAL HIGH (ref 0.0–0.2)

## 2019-04-14 LAB — IRON AND TIBC
Iron: 86 ug/dL (ref 42–163)
Saturation Ratios: 28 % (ref 20–55)
TIBC: 310 ug/dL (ref 202–409)
UIBC: 225 ug/dL (ref 117–376)

## 2019-04-14 LAB — SAVE SMEAR(SSMR), FOR PROVIDER SLIDE REVIEW

## 2019-04-14 LAB — VITAMIN B12: Vitamin B-12: 369 pg/mL (ref 180–914)

## 2019-04-14 LAB — FERRITIN: Ferritin: 120 ng/mL (ref 24–336)

## 2019-04-14 LAB — LACTATE DEHYDROGENASE: LDH: 455 U/L — ABNORMAL HIGH (ref 98–192)

## 2019-04-16 LAB — PATHOLOGIST SMEAR REVIEW

## 2019-04-22 ENCOUNTER — Other Ambulatory Visit: Payer: Self-pay

## 2019-04-22 ENCOUNTER — Inpatient Hospital Stay: Payer: PRIVATE HEALTH INSURANCE | Admitting: Oncology

## 2019-04-22 ENCOUNTER — Telehealth: Payer: Self-pay | Admitting: Oncology

## 2019-04-22 VITALS — BP 113/82 | HR 73 | Temp 98.7°F | Resp 17 | Ht 71.0 in | Wt 179.0 lb

## 2019-04-22 DIAGNOSIS — C946 Myelodysplastic disease, not classified: Secondary | ICD-10-CM | POA: Diagnosis not present

## 2019-04-22 DIAGNOSIS — D649 Anemia, unspecified: Secondary | ICD-10-CM

## 2019-04-22 NOTE — Progress Notes (Signed)
Hematology and Oncology Follow Up Visit  Andrew Mccoy [id number] September 13, 1956 63 y.o. 04/22/2019 9:30 AM System, Pcp Not InNo ref. provider found   Principle Diagnosis: 63 year old man with anemia in the setting of abnormal peripheral smear including teardrop cells and consideration for myeloproliferative disorder diagnosed in April 2020.   Current therapy: Active surveillance without any need for therapy.  Interim History: Andrew Mccoy presents today for a follow-up visit.  Since the last visit, he reports no major changes in his health.  He continues to be completely asymptomatic.  He denies any abdominal pain, fevers or constitutional symptoms.  He remains active and continues to exercise regularly.  He has not reported any decline in his exercise tolerance or any hospitalizations.   Patient denied any alteration mental status, neuropathy, confusion or dizziness.  Denies any headaches or lethargy.  Denies any night sweats, weight loss or changes in appetite.  Denied orthopnea, dyspnea on exertion or chest discomfort.  Denies shortness of breath, difficulty breathing hemoptysis or cough.  Denies any abdominal distention, nausea, early satiety or dyspepsia.  Denies any hematuria, frequency, dysuria or nocturia.  Denies any skin irritation, dryness or rash.  Denies any ecchymosis or petechiae.  Denies any lymphadenopathy or clotting.  Denies any heat or cold intolerance.  Denies any anxiety or depression.  Remaining review of system is negative.       Medications: I have reviewed the patient's current medications.  Current Outpatient Medications  Medication Sig Dispense Refill  . sodium chloride (OCEAN) 0.65 % SOLN nasal spray Place 2 sprays into both nostrils every 2 (two) hours while awake.  0   No current facility-administered medications for this visit.      Allergies: No Known Allergies  Past Medical History, Surgical history, Social history, and Family History were reviewed and  updated.    Physical Exam: Blood pressure 113/82, pulse 73, temperature 98.7 F (37.1 C), temperature source Oral, resp. rate 17, height _0  (1.803 m), weight 179 lb (81.2 kg), SpO2 98 %.   ECOG: 0    General appearance: Comfortable appearing without any discomfort Head: Normocephalic without any trauma Oropharynx: Mucous membranes are moist and pink without any thrush or ulcers. Eyes: Pupils are equal and round reactive to light. Lymph nodes: No cervical, supraclavicular, inguinal or axillary lymphadenopathy.   Heart:regular rate and rhythm.  S1 and S2 without leg edema. Lung: Clear without any rhonchi or wheezes.  No dullness to percussion. Abdomin: Soft, nontender, nondistended with good bowel sounds.  Spleen was not palpable. Musculoskeletal: No joint deformity or effusion.  Full range of motion noted. Neurological: No deficits noted on motor, sensory and deep tendon reflex exam. Skin: No petechial rash or dryness.  Appeared moist.      Lab Results: Lab Results  Component Value Date   WBC 6.6 04/14/2019   HGB 12.2 (L) 04/14/2019   HCT 36.9 (L) 04/14/2019   MCV 95.1 04/14/2019   PLT 240 04/14/2019     Chemistry      Component Value Date/Time   NA 140 04/14/2019 1102   NA 141 10/20/2018 1020   K 4.1 04/14/2019 1102   CL 107 04/14/2019 1102   CO2 22 04/14/2019 1102   BUN 15 04/14/2019 1102   BUN 21 10/20/2018 1020   CREATININE 1.07 04/14/2019 1102      Component Value Date/Time   CALCIUM 9.4 04/14/2019 1102   ALKPHOS 53 04/14/2019 1102   AST 22 04/14/2019 1102   ALT 26 04/14/2019 1102  BILITOT 1.1 04/14/2019 1102        Impression and Plan:   63 year old man with:  1.  Evaluation for myeloproliferative disorder: He presented with anemia, nucleated red cells and teardrop cells on his peripheral smear in April 2020.  Repeat work-up on 04/14/2019 showed a hemoglobin of 12.2 with normal differential although he still has nucleated red cells.  His  peripheral smear showed persistent nucleated red cells with rare blasts and teardrop cells.  These findings support leukoerythroblastic changes that could reflect a bone marrow infiltrative process.  Although his hematological parameters are very close to normal these findings are concerning for early myeloproliferative disorder.  To fully evaluate this he will require a bone marrow biopsy.  Risks and benefits of this procedure were reviewed today.  These complications include pain, bleeding and infection that are fairly rare.  Alternatively, continued observation and surveillance will be reasonable as well although not preferable.  After discussion today, he opted to continue with close monitoring and repeat laboratory testing in 3 months.  2.  Follow-up: We will be in 3 months for repeat evaluation.   25  minutes was spent with the patient face-to-face today.  More than 50% of time was spent on reviewing his laboratory data, differential diagnosis, management options and complications related to procedure recommended.    Zola Button, MD 7/29/20209:30 AM

## 2019-04-22 NOTE — Telephone Encounter (Signed)
Called and left msg. Mailed printout  °

## 2019-04-23 ENCOUNTER — Encounter: Payer: Self-pay | Admitting: Registered Nurse

## 2019-04-23 ENCOUNTER — Telehealth: Payer: Self-pay | Admitting: Registered Nurse

## 2019-04-23 NOTE — Telephone Encounter (Signed)
Patient saw hemonc yesterday.  Labs still slightly anemic with positive NRBCs patient refused bone marrow biopsy specialist thinks possible early myeloproliferative disorder.  Patient agreed to labs and follow up in 3 months scheduled 07/22/2019.  Patient feeling well at this time.

## 2019-05-06 LAB — JAK2 (INCLUDING V617F AND EXON 12), MPL,& CALR-NEXT GEN SEQ

## 2019-05-19 NOTE — Progress Notes (Signed)
Repeat CBC 01/02/2019 and has established care with hematology

## 2019-07-13 ENCOUNTER — Telehealth: Payer: Self-pay | Admitting: Oncology

## 2019-07-13 NOTE — Telephone Encounter (Signed)
Returned patient's phone call regarding cancelling an appointment, per patient request 10/28 appointment has been cancelled. Left a voicemail for patient to reschedule.

## 2019-07-22 ENCOUNTER — Ambulatory Visit: Payer: PRIVATE HEALTH INSURANCE | Admitting: Oncology

## 2019-07-22 ENCOUNTER — Other Ambulatory Visit: Payer: PRIVATE HEALTH INSURANCE

## 2020-01-27 ENCOUNTER — Telehealth: Payer: Self-pay | Admitting: Registered Nurse

## 2020-01-27 NOTE — Telephone Encounter (Signed)
Patient cancelled Oct 2020 follow up with hematology/oncology.  Please call him to remind him appt still needs to be rescheduled as follow up labs were to be done.  Anemia with NRBCs on CBC 04/14/2019  Specialist was considering bone marrow biopsy per last epic note.  "Assessment and Plan:   64 year old man with the following:  1.  Anemia with nucleated red cells in the peripheral smear.  The differential diagnosis was discussed today with the patient as well as management strategies.  His hemoglobin remains close to normal range although slightly declining.  He has normal white cell count and platelet count with normal differential.  Possibility to explain his nucleated red cells could be related to a myeloproliferative disorder such as myelofibrosis among others.  Extramedullary hematopoiesis could also be a possibility.  He does not have any splenomegaly on exam nor any constitutional symptoms.  From a management standpoint, I have recommended close observation in the immediate future.  I will repeat his CBC, iron study as well as a peripheral smear.  I will also obtain a myeloproliferative disorder profile.  Bone marrow biopsy was also discussed which is a possibility if these abnormalities persist.  Complications and risks and benefits associated with this procedure was reviewed today and he is willing to proceed if needed to in the future.  2.  Follow-up will be in the next few months for repeat laboratory testing and evaluation"    Noted on 10/19 in [place]  Hematology office staff returned patient's phone call regarding cancelling an appointment, per patient request 10/28 appointment has been cancelled. Left a voicemail for patient to reschedule. "

## 2020-02-05 NOTE — Telephone Encounter (Signed)
RN spoke with pt by email 11/19/19 reminding pt of need to reschedule Oct 2020 cancelled appt. He denied any sx including fatigue, fever/infections, wt loss, night sweats, pain, skin changes, hot/cold intolerances. He reported "I am going to reschedule my appointment with Dr. Alen Blew soon; I am a little bit concerned about Covid-19 situation in hospitals." No new appt noted in [facility]. RN replied to this email today with reminder to scheduled f/u as he is now 7 months overdue and 10 months since being seen originally.

## 2020-02-08 NOTE — Telephone Encounter (Signed)
Noted RN Hildred Alamin reminded patient of overdue hematology follow up; patient hesitant to have appts outside of work clinic due to covid pandemic.  Will follow up with him to see if completed vaccination series/wearing mask/social distancing/frequent handwashing and discuss any covid concerns he may have.

## 2020-02-09 NOTE — Telephone Encounter (Signed)
RN Hildred Alamin notified me patient replied to email again today stating he would schedule follow up appt with hematology but none noted in Epic yet.

## 2020-02-23 NOTE — Telephone Encounter (Signed)
No follow up scheduled with hematology per Epic.  Message left for patient reminder to schedule appt.

## 2020-03-03 NOTE — Telephone Encounter (Signed)
Epic reviewed no appt scheduled with hematology

## 2020-03-08 NOTE — Telephone Encounter (Signed)
Reviewed Epic no pending hematology appts scheduled for patient.

## 2020-03-17 NOTE — Telephone Encounter (Signed)
Please schedule patient for CBC and remind him regarding scheduling follow up with hematology for annual re-evaluation.

## 2020-03-18 NOTE — Telephone Encounter (Signed)
Pt scheduled for CBC draw Monday 6/28 at 0900.

## 2020-03-21 ENCOUNTER — Other Ambulatory Visit: Payer: Self-pay

## 2020-03-21 ENCOUNTER — Ambulatory Visit: Payer: Self-pay | Admitting: *Deleted

## 2020-03-21 DIAGNOSIS — R7989 Other specified abnormal findings of blood chemistry: Secondary | ICD-10-CM

## 2020-03-21 NOTE — Progress Notes (Signed)
CBC per NP orders as over due since Oct 2020.

## 2020-03-23 LAB — CBC WITH DIFFERENTIAL/PLATELET
Basophils Absolute: 0.1 10*3/uL (ref 0.0–0.2)
Basos: 2 %
EOS (ABSOLUTE): 0.1 10*3/uL (ref 0.0–0.4)
Eos: 1 %
Hematocrit: 39.4 % (ref 37.5–51.0)
Hemoglobin: 12.7 g/dL — ABNORMAL LOW (ref 13.0–17.7)
Lymphocytes Absolute: 2.2 10*3/uL (ref 0.7–3.1)
Lymphs: 32 %
MCH: 31.5 pg (ref 26.6–33.0)
MCHC: 32.2 g/dL (ref 31.5–35.7)
MCV: 98 fL — ABNORMAL HIGH (ref 79–97)
Monocytes Absolute: 0.3 10*3/uL (ref 0.1–0.9)
Monocytes: 5 %
NRBC: 2 % — ABNORMAL HIGH (ref 0–0)
Neutrophils Absolute: 3.8 10*3/uL (ref 1.4–7.0)
Neutrophils: 55 %
Platelets: 187 10*3/uL (ref 150–450)
RBC: 4.03 x10E6/uL — ABNORMAL LOW (ref 4.14–5.80)
RDW: 16.2 % — ABNORMAL HIGH (ref 11.6–15.4)
WBC: 6.9 10*3/uL (ref 3.4–10.8)

## 2020-03-23 LAB — IMMATURE CELLS
Blasts/blast like cells: 3 % — ABNORMAL HIGH (ref 0–0)
MYELOCYTES: 1 % — ABNORMAL HIGH (ref 0–0)
Metamyelocytes: 1 % — ABNORMAL HIGH (ref 0–0)

## 2020-03-31 NOTE — Telephone Encounter (Signed)
Labs completed slightly worsening NRBCs, still anemic recommend follow up with hematology.  Patient cell busy, sent email.

## 2020-04-01 NOTE — Telephone Encounter (Signed)
Patient notified of recent CBC results in person yesterday.  Epic review today to see if hematology appt scheduled and none noted as pending.

## 2020-04-07 NOTE — Telephone Encounter (Signed)
No hematology appts noted in epic since CBC draw please follow up with patient to see if he was able to schedule appt.

## 2020-04-08 NOTE — Telephone Encounter (Signed)
Email sent to pt this morning asking if there was any hindrances to him booking hematology appt and offering to book for pt if needed. No response back yet.

## 2020-04-08 NOTE — Telephone Encounter (Signed)
Noted message left for patient by RN Hildred Alamin asking if appt booking assistance needed for hematology

## 2020-04-12 NOTE — Telephone Encounter (Signed)
Mesg left for scheduler requesting call back.

## 2020-04-12 NOTE — Telephone Encounter (Signed)
Notified by RN Hildred Alamin patient requested assistance with hematology appt booking for September and she will contact his provider's office.

## 2020-04-12 NOTE — Telephone Encounter (Signed)
noted 

## 2020-04-13 ENCOUNTER — Telehealth: Payer: Self-pay | Admitting: Oncology

## 2020-04-13 NOTE — Telephone Encounter (Signed)
Scheduled appt per 7/20 sch msg - left message with appt date and time

## 2020-04-14 NOTE — Telephone Encounter (Signed)
Notified by [facility] Render hematology/oncology office would like to see patient 04/28/2020.  Telephone message left for patient to contact clinic to discuss appt date since provider has requested August date and patient preferred September.

## 2020-04-19 NOTE — Telephone Encounter (Signed)
Patient contacted RN Hildred Alamin and stated on vacation most of August and still prefers September appt.

## 2020-04-28 ENCOUNTER — Other Ambulatory Visit: Payer: PRIVATE HEALTH INSURANCE

## 2020-04-28 ENCOUNTER — Ambulatory Visit: Payer: PRIVATE HEALTH INSURANCE | Admitting: Oncology

## 2020-05-02 NOTE — Telephone Encounter (Signed)
Noted appt in Epic with hematology 05/19/2020

## 2020-05-19 ENCOUNTER — Inpatient Hospital Stay: Payer: PRIVATE HEALTH INSURANCE | Admitting: Oncology

## 2020-05-19 ENCOUNTER — Inpatient Hospital Stay: Payer: PRIVATE HEALTH INSURANCE | Attending: Oncology

## 2020-05-19 ENCOUNTER — Other Ambulatory Visit: Payer: Self-pay

## 2020-05-19 VITALS — BP 120/71 | HR 66 | Temp 98.2°F | Resp 18 | Ht 71.0 in | Wt 181.2 lb

## 2020-05-19 DIAGNOSIS — D649 Anemia, unspecified: Secondary | ICD-10-CM

## 2020-05-19 DIAGNOSIS — D539 Nutritional anemia, unspecified: Secondary | ICD-10-CM | POA: Diagnosis present

## 2020-05-19 LAB — CBC WITH DIFFERENTIAL (CANCER CENTER ONLY)
Abs Immature Granulocytes: 0.5 10*3/uL — ABNORMAL HIGH (ref 0.00–0.07)
Basophils Absolute: 0 10*3/uL (ref 0.0–0.1)
Basophils Relative: 1 %
Eosinophils Absolute: 0 10*3/uL (ref 0.0–0.5)
Eosinophils Relative: 0 %
HCT: 37.9 % — ABNORMAL LOW (ref 39.0–52.0)
Hemoglobin: 12.3 g/dL — ABNORMAL LOW (ref 13.0–17.0)
Immature Granulocytes: 9 %
Lymphocytes Relative: 20 %
Lymphs Abs: 1.2 10*3/uL (ref 0.7–4.0)
MCH: 31.1 pg (ref 26.0–34.0)
MCHC: 32.5 g/dL (ref 30.0–36.0)
MCV: 95.7 fL (ref 80.0–100.0)
Monocytes Absolute: 0.3 10*3/uL (ref 0.1–1.0)
Monocytes Relative: 5 %
Neutro Abs: 3.8 10*3/uL (ref 1.7–7.7)
Neutrophils Relative %: 65 %
Platelet Count: 206 10*3/uL (ref 150–400)
RBC: 3.96 MIL/uL — ABNORMAL LOW (ref 4.22–5.81)
RDW: 16 % — ABNORMAL HIGH (ref 11.5–15.5)
WBC Count: 5.8 10*3/uL (ref 4.0–10.5)
WBC Morphology: 2
nRBC: 1.9 % — ABNORMAL HIGH (ref 0.0–0.2)

## 2020-05-19 NOTE — Progress Notes (Signed)
Hematology and Oncology Follow Up Visit  JEDI CATALFAMO [id number] [birth year] 64 y.o. 05/19/2020 2:58 PM System, Pcp Not InNo ref. provider found   Principle Diagnosis: 64 year old man with macrocytic anemia diagnosed in April 2020.  Differential diagnosis including a myelodysplastic syndrome versus myeloproliferative disorder.    Current therapy: Active surveillance and has declined bone marrow biopsy.  Interim History: [name] is here for a follow-up evaluation.  Since the last visit, he reports no major changes in his health.  Continues to be active and attends to activities of daily living.  Continues to workout regularly and enjoy excellent quality of life and performance status.  He had denied any fevers, chills or night sweats.  He denies any constitutional symptoms or excessive fatigue.       Medications: Reviewed and updated. Current Outpatient Medications  Medication Sig Dispense Refill  . sodium chloride (OCEAN) 0.65 % SOLN nasal spray Place 2 sprays into both nostrils every 2 (two) hours while awake.  0   No current facility-administered medications for this visit.     Allergies: No Known Allergies    Physical Exam: Blood pressure 120/71, pulse 66, temperature 98.2 F (36.8 C), temperature source Tympanic, resp. rate 18, height _0  (1.803 m), weight 181 lb 3.2 oz (82.2 kg), SpO2 98 %.    ECOG: 0    General appearance: Alert, awake without any distress. Head: Atraumatic without abnormalities Oropharynx: Without any thrush or ulcers. Eyes: No scleral icterus. Lymph nodes: No lymphadenopathy noted in the cervical, supraclavicular, or axillary nodes Heart:regular rate and rhythm, without any murmurs or gallops.   Lung: Clear to auscultation without any rhonchi, wheezes or dullness to percussion. Abdomin: Soft, nontender without any shifting dullness or ascites. Musculoskeletal: No clubbing or cyanosis. Neurological: No motor or sensory deficits. Skin:  No rashes or lesions.     Lab Results: Lab Results  Component Value Date   WBC 6.9 03/21/2020   HGB 12.7 (L) 03/21/2020   HCT 39.4 03/21/2020   MCV 98 (H) 03/21/2020   PLT 187 03/21/2020     Chemistry      Component Value Date/Time   NA 140 04/14/2019 1102   NA 141 10/20/2018 1020   K 4.1 04/14/2019 1102   CL 107 04/14/2019 1102   CO2 22 04/14/2019 1102   BUN 15 04/14/2019 1102   BUN 21 10/20/2018 1020   CREATININE 1.07 04/14/2019 1102      Component Value Date/Time   CALCIUM 9.4 04/14/2019 1102   ALKPHOS 53 04/14/2019 1102   AST 22 04/14/2019 1102   ALT 26 04/14/2019 1102   BILITOT 1.1 04/14/2019 1102        Impression and Plan:   64 year old man with:  1.  Macrocytosis and mild anemia noted in April 2020.  His peripheral smear indicates nucleated red cells with hemoglobin has been above 12.  Differential diagnosis was reviewed again and laboratory testing the last year were reiterated.  His hemoglobin on March 21, 2020 was 12.7 with an MCV of 98.  His white cell count platelet count appears normal.  He does have elements of nucleated red cells as well as metamyelocytes indicating leukoerythroblastic reaction.  These findings are usually consistent with a myeloproliferative disorder such as myelofibrosis among others.  To fully investigate that he will require a bone marrow biopsy which I have discussed with him on few occasions.  Laboratory data from today reviewed and showed overall stable counts without any further decline.  He still could  harbor myeloproliferative disorder but at this time he is declining additional work-up.  The plan is to repeat laboratory testing in 6 months and continued monitoring.   2.  Follow-up: In 6 months for a repeat laboratory testing and MD follow-up.   30  minutes were dedicated to this visit. The time was spent on reviewing laboratory data, discussing treatment options, discussing differential diagnosis and answering questions  regarding future plan.      Zola Button, MD 8/26/20212:58 PM

## 2020-05-20 ENCOUNTER — Telehealth: Payer: Self-pay | Admitting: Oncology

## 2020-05-20 NOTE — Telephone Encounter (Signed)
Scheduled per 08/26 los, patient has been called and voicemail was left.

## 2020-05-21 NOTE — Telephone Encounter (Signed)
Patient saw hematology oncology this week.  To have follow up appt in 6 months w/them and repeat CBC.  He refused bone marrow biopsy again.

## 2020-05-23 NOTE — Telephone Encounter (Signed)
Noted. They have scheduled pt for 11/17/20 f/u appt.

## 2020-10-23 ENCOUNTER — Telehealth: Payer: Self-pay | Admitting: Registered Nurse

## 2020-10-23 NOTE — Telephone Encounter (Signed)
HR manager Hyacinth Meeker notified NP that pt reported symptoms and positive home test on 10/22/2020.  Symptoms started on 10/19/2020 took home test and negative.    Patient contacted via telephone and stated symptoms started with weakness and cough then next day slightly increased temperature and milder symptoms.  Feeling better today at 85% for strength and still some cough.  Patient reported covid #2 vaccine in Jun or Jul cannot find his vaccine card to read me dates tonight.  Day 0 1/26  Positive test 1/29  Last day at work 1/27 called in sick Friday 1/28.  RTW 10/25/20 and strict mask use through 10/29/2020 and no use of employee lunch room discussed eating in car or meditation room/outside through Saturday 2/5.  May use straw under mask to drink fluids at workstation.  Discussed no door on workstation cannot wear mask.  Patient asked if he needs to take PCR or retest before returning to work and discussed he does not at this time.  Patient did not** develop symptom of  trouble breathing, chest pain, nausea, vomiting, diarrhea, sore throat, HA, or body aches.   Reviewed possible Covid sx including cough, shortness of breath with exertion or at rest, runny nose, congestion, sinus pain/pressure, sore throat, fever/chills, body aches, fatigue, loss of taste/smell, GI symptoms of nausea/vomiting/diarrhea. ER precautions if dizziness/syncope, confusion, blue tint to lips/face, severe shortness of breath/difficulty breathing/wheezing.   Patient to isolate in own room and if possible use only one bathroom if living with others in home.  Wear mask when out of room to help prevent spread to others in household.  Disinfect/sanitize high touch surfaces daily with bleach wipes/lysol/chlorox.  Discussed may shed virus through day 10 after positive test.   Pt verbalized understanding and agreement with plan of care. No further questions/concerns at this time. Pt reminded to contact clinic with any changes in sx or  questions/concerns.HR notified may return onsite 2/1 and strict mask use through 10/29/2020.

## 2020-10-25 NOTE — Telephone Encounter (Signed)
Spoke with pt by phone. He reports mild congestion still lingering. Did not return to work this morning as expected as he felt "just a little bit off" but has improved through the day and feels well with plan to RTW tomorrow. Reminded of strict mask use thru 2/5. Denies further questions/concerns.

## 2020-10-27 NOTE — Telephone Encounter (Signed)
Confirmed with pt he did RTW yesterday 2/2 as expected. Denies further needs. Closing encounter.

## 2020-10-27 NOTE — Telephone Encounter (Signed)
Reviewed RN Hildred Alamin notes patient returned to work on 10/26/20

## 2020-10-29 NOTE — Telephone Encounter (Signed)
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  Feeling well and no questions or concerns.

## 2020-11-17 ENCOUNTER — Ambulatory Visit: Payer: PRIVATE HEALTH INSURANCE | Admitting: Oncology

## 2020-11-17 ENCOUNTER — Other Ambulatory Visit: Payer: PRIVATE HEALTH INSURANCE

## 2020-12-08 ENCOUNTER — Telehealth: Payer: Self-pay | Admitting: Oncology

## 2020-12-08 NOTE — Telephone Encounter (Signed)
Rescheduled 03/22 appointment to 03/25 per patient's request.

## 2020-12-13 ENCOUNTER — Ambulatory Visit: Payer: PRIVATE HEALTH INSURANCE | Admitting: Oncology

## 2020-12-13 ENCOUNTER — Other Ambulatory Visit: Payer: PRIVATE HEALTH INSURANCE

## 2020-12-14 ENCOUNTER — Telehealth: Payer: Self-pay | Admitting: Oncology

## 2020-12-14 NOTE — Telephone Encounter (Signed)
R/s 3/25 appt per patient request from sch msg. Called and spoke with patient. Confirmed new date and time

## 2020-12-16 ENCOUNTER — Ambulatory Visit: Payer: PRIVATE HEALTH INSURANCE | Admitting: Oncology

## 2020-12-16 ENCOUNTER — Other Ambulatory Visit: Payer: PRIVATE HEALTH INSURANCE

## 2021-01-24 ENCOUNTER — Other Ambulatory Visit: Payer: Self-pay

## 2021-01-24 ENCOUNTER — Inpatient Hospital Stay: Payer: PRIVATE HEALTH INSURANCE | Attending: Oncology

## 2021-01-24 ENCOUNTER — Inpatient Hospital Stay: Payer: PRIVATE HEALTH INSURANCE | Admitting: Oncology

## 2021-01-24 VITALS — BP 125/82 | HR 65 | Temp 97.3°F | Resp 18 | Wt 177.9 lb

## 2021-01-24 DIAGNOSIS — D649 Anemia, unspecified: Secondary | ICD-10-CM

## 2021-01-24 DIAGNOSIS — D7589 Other specified diseases of blood and blood-forming organs: Secondary | ICD-10-CM | POA: Diagnosis not present

## 2021-01-24 DIAGNOSIS — Z8616 Personal history of COVID-19: Secondary | ICD-10-CM | POA: Insufficient documentation

## 2021-01-24 LAB — CBC WITH DIFFERENTIAL (CANCER CENTER ONLY)
Abs Immature Granulocytes: 0.47 10*3/uL — ABNORMAL HIGH (ref 0.00–0.07)
Band Neutrophils: 0 %
Basophils Absolute: 0 10*3/uL (ref 0.0–0.1)
Basophils Relative: 0 %
Blasts: 2 %
Eosinophils Absolute: 0 10*3/uL (ref 0.0–0.5)
Eosinophils Relative: 0 %
HCT: 38.3 % — ABNORMAL LOW (ref 39.0–52.0)
Hemoglobin: 12.4 g/dL — ABNORMAL LOW (ref 13.0–17.0)
Lymphocytes Relative: 23 %
Lymphs Abs: 1.4 10*3/uL (ref 0.7–4.0)
MCH: 31.2 pg (ref 26.0–34.0)
MCHC: 32.4 g/dL (ref 30.0–36.0)
MCV: 96.2 fL (ref 80.0–100.0)
Metamyelocytes Relative: 3 %
Monocytes Absolute: 0.3 10*3/uL (ref 0.1–1.0)
Monocytes Relative: 5 %
Myelocytes: 5 %
Neutro Abs: 3.7 10*3/uL (ref 1.7–7.7)
Neutrophils Relative %: 62 %
Other: 0 %
Platelet Count: 196 10*3/uL (ref 150–400)
Promyelocytes Relative: 0 %
RBC: 3.98 MIL/uL — ABNORMAL LOW (ref 4.22–5.81)
RDW: 16.1 % — ABNORMAL HIGH (ref 11.5–15.5)
WBC Count: 5.9 10*3/uL (ref 4.0–10.5)
nRBC: 0 /100 WBC
nRBC: 1 % — ABNORMAL HIGH (ref 0.0–0.2)

## 2021-01-24 NOTE — Progress Notes (Signed)
Hematology and Oncology Follow Up Visit  Andrew Mccoy [id number] May 01, 1956 65 y.o. 01/24/2021 8:32 AM Pcp, NoNo ref. provider found   Principle Diagnosis: 65 year old man with mild anemia with microcytosis noted in April 2020.  Peripheral smear shows nucleated red cells which could indicate myelodysplastic syndrome versus myeloproliferative disorder.   Current therapy: Observation and surveillance.  Bone marrow has been deferred based on patient's request.  Interim History: Andrew Mccoy returns today for a follow-up visit.  Since the last visit, he reports no major changes in his health.  He denies any excessive fatigue, tiredness, hematochezia or melena.  Denies any recent hospitalizations.  He was diagnosed with COVID-19 in January and has fully recovered without any sequela.       Medications: Updated on review. Current Outpatient Medications  Medication Sig Dispense Refill  . sodium chloride (OCEAN) 0.65 % SOLN nasal spray Place 2 sprays into both nostrils every 2 (two) hours while awake.  0   No current facility-administered medications for this visit.     Allergies: No Known Allergies    Physical Exam:  Blood pressure 125/82, pulse 65, temperature (!) 97.3 F (36.3 C), temperature source Tympanic, resp. rate 18, weight 177 lb 14.4 oz (80.7 kg), SpO2 97 %.    ECOG: 0   General appearance: Comfortable appearing without any discomfort Head: Normocephalic without any trauma Oropharynx: Mucous membranes are moist and pink without any thrush or ulcers. Eyes: Pupils are equal and round reactive to light. Lymph nodes: No cervical, supraclavicular, inguinal or axillary lymphadenopathy.   Heart:regular rate and rhythm.  S1 and S2 without leg edema. Lung: Clear without any rhonchi or wheezes.  No dullness to percussion. Abdomin: Soft, nontender, nondistended with good bowel sounds.  No hepatosplenomegaly. Musculoskeletal: No joint deformity or effusion.  Full range of  motion noted. Neurological: No deficits noted on motor, sensory and deep tendon reflex exam. Skin: No petechial rash or dryness.  Appeared moist.       Lab Results: Lab Results  Component Value Date   WBC 5.8 05/19/2020   HGB 12.3 (L) 05/19/2020   HCT 37.9 (L) 05/19/2020   MCV 95.7 05/19/2020   PLT 206 05/19/2020     Chemistry      Component Value Date/Time   NA 140 04/14/2019 1102   NA 141 10/20/2018 1020   K 4.1 04/14/2019 1102   CL 107 04/14/2019 1102   CO2 22 04/14/2019 1102   BUN 15 04/14/2019 1102   BUN 21 10/20/2018 1020   CREATININE 1.07 04/14/2019 1102      Component Value Date/Time   CALCIUM 9.4 04/14/2019 1102   ALKPHOS 53 04/14/2019 1102   AST 22 04/14/2019 1102   ALT 26 04/14/2019 1102   BILITOT 1.1 04/14/2019 1102        Impression and Plan:   65 year old man with:  1.  Mild anemia noted in April 2020 with macrocytosis and nucleated red cells.   Laboratory data from today reviewed and showed a hemoglobin of 12.4.  MCV is close to normal with still very little nucleated red cells.  The differential diagnosis was reiterated again at this time.  Myeloproliferative disorder such as myelofibrosis among others could be a possibility versus myelodysplastic syndrome.  Given the stability of his counts over the last 2 years I see no urgency to intervene at this time.  I offered him the option of a bone marrow biopsy which has continued to decline.  For the time being he opted with  active surveillance instead.  2.  Follow-up: We will be in 6 months for repeat evaluation.   30  minutes were spent on this encounter.  The time was dedicated to reviewing laboratory data, differential diagnosis and management options for the future.     Zola Button, MD 5/3/20228:32 AM

## 2021-04-17 ENCOUNTER — Ambulatory Visit: Payer: Self-pay | Admitting: *Deleted

## 2021-04-17 ENCOUNTER — Other Ambulatory Visit: Payer: Self-pay

## 2021-04-17 VITALS — BP 116/83 | Ht 66.0 in | Wt 174.0 lb

## 2021-04-17 DIAGNOSIS — Z0000 Encounter for general adult medical examination without abnormal findings: Secondary | ICD-10-CM

## 2021-04-17 NOTE — Progress Notes (Signed)
[employer] insurance premium discount evaluation: Repl[employer] ROI form signed. Tobacco Free Attestation form signed.  Forms placed in paper chart.   Unsuccessful x2 with lab draw attempts. Pt going to pcp this Friday. He is going to have drawn there and bring the results in.

## 2021-04-24 ENCOUNTER — Other Ambulatory Visit: Payer: Self-pay | Admitting: *Deleted

## 2021-04-24 DIAGNOSIS — Z0000 Encounter for general adult medical examination without abnormal findings: Secondary | ICD-10-CM

## 2021-04-24 NOTE — Progress Notes (Signed)
Orders placed for [facility] requisition as pt going tomorrow at 8:15 for lab draw.

## 2021-07-26 ENCOUNTER — Ambulatory Visit: Payer: PRIVATE HEALTH INSURANCE | Admitting: Oncology

## 2021-07-26 ENCOUNTER — Other Ambulatory Visit: Payer: PRIVATE HEALTH INSURANCE

## 2021-11-14 ENCOUNTER — Telehealth: Payer: Self-pay | Admitting: Oncology

## 2021-11-14 NOTE — Telephone Encounter (Signed)
R/Sch per 2/21 inb, pt aware.

## 2021-11-16 ENCOUNTER — Other Ambulatory Visit: Payer: Self-pay

## 2021-11-16 ENCOUNTER — Inpatient Hospital Stay (HOSPITAL_BASED_OUTPATIENT_CLINIC_OR_DEPARTMENT_OTHER): Payer: No Typology Code available for payment source | Admitting: Oncology

## 2021-11-16 ENCOUNTER — Inpatient Hospital Stay: Payer: No Typology Code available for payment source | Attending: Oncology

## 2021-11-16 VITALS — BP 125/83 | HR 81 | Temp 98.1°F | Resp 20 | Wt 179.8 lb

## 2021-11-16 DIAGNOSIS — D649 Anemia, unspecified: Secondary | ICD-10-CM

## 2021-11-16 DIAGNOSIS — D7589 Other specified diseases of blood and blood-forming organs: Secondary | ICD-10-CM | POA: Diagnosis not present

## 2021-11-16 LAB — CBC WITH DIFFERENTIAL (CANCER CENTER ONLY)
Abs Immature Granulocytes: 0.49 10*3/uL — ABNORMAL HIGH (ref 0.00–0.07)
Basophils Absolute: 0 10*3/uL (ref 0.0–0.1)
Basophils Relative: 1 %
Eosinophils Absolute: 0 10*3/uL (ref 0.0–0.5)
Eosinophils Relative: 0 %
HCT: 36.4 % — ABNORMAL LOW (ref 39.0–52.0)
Hemoglobin: 12 g/dL — ABNORMAL LOW (ref 13.0–17.0)
Immature Granulocytes: 8 %
Lymphocytes Relative: 16 %
Lymphs Abs: 1.1 10*3/uL (ref 0.7–4.0)
MCH: 31.1 pg (ref 26.0–34.0)
MCHC: 33 g/dL (ref 30.0–36.0)
MCV: 94.3 fL (ref 80.0–100.0)
Monocytes Absolute: 0.3 10*3/uL (ref 0.1–1.0)
Monocytes Relative: 5 %
Neutro Abs: 4.6 10*3/uL (ref 1.7–7.7)
Neutrophils Relative %: 70 %
Platelet Count: 211 10*3/uL (ref 150–400)
RBC: 3.86 MIL/uL — ABNORMAL LOW (ref 4.22–5.81)
RDW: 16.5 % — ABNORMAL HIGH (ref 11.5–15.5)
WBC Count: 6.5 10*3/uL (ref 4.0–10.5)
nRBC: 1.7 % — ABNORMAL HIGH (ref 0.0–0.2)

## 2021-11-16 NOTE — Progress Notes (Signed)
Hematology and Oncology Follow Up Visit  Andrew Mccoy [id number] 24-Mar-1956 66 y.o. 11/16/2021 3:40 PM Pcp, NoNo ref. provider found   Principle Diagnosis: 67 year old man with normocytic, normochromic anemia diagnosed in 2020 with nucleated red cells in the peripheral smear.  Differential diagnosis includes infiltrative bone marrow process.   Current therapy: Active surveillance patient deferred the option of a bone marrow biopsy.  Interim History: Andrew Mccoy returns today for follow-up visit.  Since the last visit, he reports feeling well without any major complaints.  He denies any weight loss or appetite changes.  He has reported right-sided flank pain after he has been going to the gym regularly.  He denies any excessive fatigue, tiredness or dyspnea on exertion.       Medications: Reviewed without changes. Current Outpatient Medications  Medication Sig Dispense Refill   sodium chloride (OCEAN) 0.65 % SOLN nasal spray Place 2 sprays into both nostrils every 2 (two) hours while awake.  0   No current facility-administered medications for this visit.     Allergies: No Known Allergies    Physical Exam:  Blood pressure 125/83, pulse 81, temperature 98.1 F (36.7 C), resp. rate 20, weight 179 lb 12.8 oz (81.6 kg), SpO2 97 %.    ECOG: 0     General appearance: Alert, awake without any distress. Head: Atraumatic without abnormalities Oropharynx: Without any thrush or ulcers. Eyes: No scleral icterus. Lymph nodes: No lymphadenopathy noted in the cervical, supraclavicular, or axillary nodes Heart:regular rate and rhythm, without any murmurs or gallops.   Lung: Clear to auscultation without any rhonchi, wheezes or dullness to percussion. Abdomin: Soft, nontender without any shifting dullness or ascites. Musculoskeletal: No clubbing or cyanosis. Neurological: No motor or sensory deficits. Skin: No rashes or lesions.        Lab Results: Lab Results   Component Value Date   WBC 6.5 11/16/2021   HGB 12.0 (L) 11/16/2021   HCT 36.4 (L) 11/16/2021   MCV 94.3 11/16/2021   PLT 211 11/16/2021     Chemistry      Component Value Date/Time   NA 140 04/14/2019 1102   NA 141 10/20/2018 1020   K 4.1 04/14/2019 1102   CL 107 04/14/2019 1102   CO2 22 04/14/2019 1102   BUN 15 04/14/2019 1102   BUN 21 10/20/2018 1020   CREATININE 1.07 04/14/2019 1102      Component Value Date/Time   CALCIUM 9.4 04/14/2019 1102   ALKPHOS 53 04/14/2019 1102   AST 22 04/14/2019 1102   ALT 26 04/14/2019 1102   BILITOT 1.1 04/14/2019 1102        Impression and Plan:   66 year old man with:  1.  Anemia diagnosed in 2020 after presenting with mild macrocytosis and a hemoglobin of 12 with nucleated red cells on the peripheral smear.    The differential diagnosis was reviewed at this time and management choices were discussed.  Infiltrative bone marrow disease is still a consideration including myelofibrosis or other myeloproliferative disorders.  Plasma cell disorder such as multiple myeloma is considered less likely but it is certainly on the differential.   From a management standpoint, I recommended obtaining a bone marrow biopsy which she continues to defer at this time.  His anemia is mild and he is currently asymptomatic and is reasonable to continue to observe.  We will repeat laboratory testing in 6 months.   2.  Follow-up: In 6 months for a follow-up visit.   30  minutes were  dedicated to this visit.  The time was spent on reviewing laboratory data, disease status update outlining future plan of care discussion.     Zola Button, MD 2/23/20233:40 PM

## 2021-11-17 ENCOUNTER — Inpatient Hospital Stay: Payer: No Typology Code available for payment source | Admitting: Oncology

## 2021-11-17 ENCOUNTER — Inpatient Hospital Stay: Payer: No Typology Code available for payment source

## 2021-11-24 ENCOUNTER — Telehealth: Payer: Self-pay | Admitting: Oncology

## 2021-11-24 NOTE — Telephone Encounter (Signed)
Scheduled per 02/24 los, patient has been called and notified. ?

## 2022-03-01 ENCOUNTER — Other Ambulatory Visit: Payer: Self-pay | Admitting: Registered Nurse

## 2022-03-03 ENCOUNTER — Encounter: Payer: Self-pay | Admitting: Registered Nurse

## 2022-03-03 ENCOUNTER — Telehealth: Payer: Self-pay | Admitting: Registered Nurse

## 2022-03-03 DIAGNOSIS — R7989 Other specified abnormal findings of blood chemistry: Secondary | ICD-10-CM

## 2022-03-03 DIAGNOSIS — E559 Vitamin D deficiency, unspecified: Secondary | ICD-10-CM

## 2022-03-03 DIAGNOSIS — E785 Hyperlipidemia, unspecified: Secondary | ICD-10-CM

## 2022-03-03 DIAGNOSIS — R7303 Prediabetes: Secondary | ICD-10-CM | POA: Insufficient documentation

## 2022-03-03 LAB — CMP12+LP+TP+TSH+6AC+PSA+CBC…
ALT: 25 IU/L
AST: 23 IU/L (ref 0–40)
Albumin/Globulin Ratio: 2.5 — ABNORMAL HIGH (ref 1.2–2.2)
Albumin: 4.8 g/dL (ref 3.8–4.8)
Alkaline Phosphatase: 65 IU/L (ref 44–121)
BUN/Creatinine Ratio: 15
BUN: 17 mg/dL
Basophils Absolute: 0.2 10*3/uL
Basos: 2 %
Bilirubin Total: 1.1 mg/dL (ref 0.0–1.2)
Calcium: 9.7 mg/dL
Chloride: 101 mmol/L (ref 96–106)
Chol/HDL Ratio: 6.1 ratio
Cholesterol, Total: 182 mg/dL
Creatinine, Ser: 1.1 mg/dL
EOS (ABSOLUTE): 0 10*3/uL
Eos: 0 %
Free Thyroxine Index: 1.9 (ref 1.2–4.9)
GGT: 27 IU/L
Globulin, Total: 1.9 g/dL (ref 1.5–4.5)
Glucose: 129 mg/dL — ABNORMAL HIGH (ref 70–99)
HDL: 30 mg/dL — ABNORMAL LOW (ref >39)
Hematocrit: 38.2 %
Hemoglobin: 12.7 g/dL
Iron: 99 ug/dL
LDH: 474 IU/L
LDL Chol Calc (NIH): 106 mg/dL
Lymphocytes Absolute: 1.3 10*3/uL
Lymphs: 17 %
MCH: 30.9 pg
MCHC: 33.2 g/dL
MCV: 93 fL
Monocytes Absolute: 0.4 10*3/uL
Monocytes: 5 %
NRBC: 3 % — ABNORMAL HIGH
Neutrophils Absolute: 5.2 10*3/uL
Neutrophils: 65 %
Phosphorus: 3.6 mg/dL
Platelets: 201 10*3/uL (ref 150–450)
Potassium: 4.5 mmol/L (ref 3.5–5.2)
Prostate Specific Ag, Serum: 0.3 ng/mL
RBC: 4.11 x10E6/uL
RDW: 15.8 %
Sodium: 137 mmol/L (ref 134–144)
T3 Uptake Ratio: 26 %
T4, Total: 7.4 ug/dL (ref 4.5–12.0)
TSH: 3.96 u[IU]/mL (ref 0.450–4.500)
Total Protein: 6.7 g/dL (ref 6.0–8.5)
Triglycerides: 269 mg/dL
Uric Acid: 6.7 mg/dL
VLDL Cholesterol Cal: 46 mg/dL — ABNORMAL HIGH (ref 5–40)
WBC: 7.7 10*3/uL (ref 3.4–10.8)
eGFR: 56 mL/min/{1.73_m2} — ABNORMAL LOW (ref >59)

## 2022-03-03 LAB — HGB A1C W/O EAG: Hgb A1c MFr Bld: 5.8 % — ABNORMAL HIGH (ref 4.8–5.6)

## 2022-03-03 LAB — IMMATURE CELLS
Bands(Auto) Relative: 2 %
Blasts/blast like cells: 3 % — ABNORMAL HIGH (ref 0–0)
MYELOCYTES: 4 % — ABNORMAL HIGH
Metamyelocytes: 2 % — ABNORMAL HIGH

## 2022-03-03 LAB — VITAMIN D 25 HYDROXY (VIT D DEFICIENCY, FRACTURES): Vit D, 25-Hydroxy: 29 ng/mL — ABNORMAL LOW (ref 30.0–100.0)

## 2022-03-03 NOTE — Telephone Encounter (Signed)
Patient last saw hematology Dr Alen Blew 11/16/21 and follow up recommended in 6 months e.g. 05/16/22  Patient had [facility] labs drawn 03/01/22 CBC still abnormal results similar to previous on file per Epic review.  Will have RN Hildred Alamin send latest results to Dr Hazeline Junker office to see if any changes to follow up plan based on current results.  Hgab1c elevated I recommend repeat in 3 months, consultation with dietitian [facility] has on staff and 6 free visits information sent to patient how to schedule appt.  Nonfasting executive panel had banana and power bar am of draw.  Spot glucose normal for nonfasting.  Please give prediabetes/prediabetes diet handouts.  Keep added sugars in diet under 35 grams per day (6 teaspoons).  Egfr slightly low 56  (kidney function) avoid dehydration, OTC medications/supplements without first consulting with pharmacist or provider.  Vitamin D slightly low recommend increasing dose of D3 (cholecalciferol) if taking every day 2000 units increase to 4000 units and repeat level in 3-6 months goal 30-60 blood level.  Please give vitamin D deficiency handout.  Cholesterol triglycerides and LDL elevated and low HDL.  Continue exercise 150 minutes per week.  Consider high fiber diet 30 grams per day.  Discuss with PCM if recommended starting statin since above average risk for CVD event  ratio 6.1 (average risk 4.4 and 2x risk 7.1)  Electrolytes, prostate, iron, and thyroid/liver function normal  Please give patient printed copy of lab results and ask if he wants copy sent to [facility] [place] office.   Glucose  129 High mg/dL 70 - 99 Uric Acid  6.7                              No patient age and/or gender provided                                   or "N" placed in gender box                             Age                Male          Male                          0 - 30 days        3.9 - 7.8      2.7 - 6.5                          1 -  6 months      1.9 - 8.1      2.0 - 6.6                           7 - 11 months      2.0 - 6.5      2.1 - 5.7                          1 -  5 years       2.2 - 5.5      2.0 - 5.0  6 - 11 years       2.2 - 5.5      2.4 - 5.6                              12 years       2.9 - 7.0      2.9 - 6.1                         13 - 17 years       3.9 - 7.7      2.9 - 6.1                         18 - 50 years       3.8 - 8.4      2.6 - 6.2                         51 - 70 years       3.8 - 8.4      3.0 - 7.2                             >70 years       3.8 - 8.4      3.1 - 7.9                           Therapeutic target for gout patients: <6.0 mg/dL  BUN  17                              No patient age and/or gender provided                                   or "N" placed in gender box                             Age                Male          Male                          0 - 11 months        3 - 18         3 - 18                          1 - 17 years         5 - 37         5 - 74                         18 - 39 years         68 - 55         6 - 22                         40 - 85 years  6 - 24         6 - 24                         60 - 89 years         8 - 27         8 - 27                             >89 years        10 - 2        10 - 36 mg/dL  Creatinine  1.10                              No patient age and/or gender provided                                   or "N" placed in gender box                             Age                Male          Male                          0 - 60 days        .44 - 1.19     .44 - 1.19                    61 days - 11 months      .17 - 1.18     .17 - 1.18                          1 -  2 years       .19 -  .42     .19 -  .42                          3 -  4 years       .26 -  .51     .26 -  .51                          5 -  6 years       .30 -  .59     .30 -  .59                          7 -  8 years       .37 -  .62     .37 -  .62                          9 - 10 years        .39 -  .70     .39 -  .70                         11 -  12 years       .42 -  .75     .42 -  .75                         13 - 14 years       .49 -  .90     .49 -  .90                             >14 years       .76 - 1.27     .57 - 1.00 mg/dL  eGFR  56 Low mL/min/1.73  BUN/Creatinine Ratio  15                              No patient age and/or gender provided                                   or "N" placed in gender box                           Age                  Male          Male                   0 days   -  7 days          9 - 25         9 - 26                   8 days   - 30 days          8 - 100        10 - 50                   1 month  -  6 months       11 - 45        11 - 54                   7 months -  1 year         20 - 35        20 - 35                   2 years  -  5 years        25 - 55        19 - 86                   6 years  - 48 years        79 - 76        13 - 84                  13 years  - 6 years        29 - 5        10 - 35                  18 years  - 3 years  9 - 20         9 - 23                             >59 years        10 - 24        12 - 28 Sodium  137 mmol/L 134 - 144 Potassium  4.5 mmol/L 3.5 - 5.2 Chloride  101 mmol/L 96 - 106 Calcium  9.7                              No patient age and/or gender provided                                   or "N" placed in gender box                             Age                Male          Male                          0 - 10 days        8.6 - 10.4     8.6 - 10.4                    11 days -  1 year        9.2 - 11.0     9.2 - 11.0                          2 - 11 years       9.1 - 10.5     9.1 - 10.5                         12 - 17 years       8.9 - 10.4     8.9 - 10.4                         18 - 59 years       8.7 - 10.2     8.7 - 10.2                             >59 years       8.6 - 10.2     8.7 - 10.3 mg/dL  Phosphorus  3.6                              No patient age and/or gender  provided                                   or "N" placed in gender box                             Age  Male          Male                          0 -  6 months      4.2 - 7.8      3.4 - 7.9                   7 months -  1 year        4.0 - 6.8      4.0 - 6.8                          2 - 19 years       3.4 - 5.5      3.3 - 5.1                             >19 years       2.8 - 4.1      3.0 - 4.3 mg/dL  Protein, Total  6.7 g/dL 6.0 - 8.5 Albumin  4.8 g/dL 3.8 - 4.8 Globulin, Total  1.9 g/dL 1.5 - 4.5 A/G Ratio  2.5 High  1.2 - 2.2 Bilirubin, Total  1.1 mg/dL 0.0 - 1.2 Alkaline Phosphatase  65 IU/L 44 - 121 LDH  474                              No patient age and/or gender provided                                   or "N" placed in gender box                             Age                Male          Male                          0 -  7 days        123 - 237      123 - 237                          8 - 30 days        126 - 331      130 - 275                          1 - 11 months      143 - 381      128 - 376                          1 -  3 years       195 - 361      192 - 352                          4 -  6 years       180 -  313      180 - 311                          7 -  9 years       166 - 291      166 - 290                         10 - 12 years       69 - 19      135 - 260                         69 - 15 years       126 - 46      118 - 215                         76 - 17 years       118 - 222      114 - 209                             >17 years       121 - 224      119 - 226 IU/L  AST (SGOT)  23 IU/L 0 - 40 ALT (SGPT)  25                              No patient age and/or gender provided                                   or "N" placed in gender box                             Age                Male          Male                          0 - 11 years         0 - 43         0 - 12                         12 - 17 years         0 - 30          0 - 24                             >17 years         0 - 44         0 - 32 IU/L  GGT  27                              No patient age and/or gender provided  or "N" placed in gender box                             Age                Male          Male                           All ages            27 - 36         0 - 62 IU/L  Iron  37                                No patient age and/or gender provided                                or "N" placed in gender box                                      Male                                            0 - 30 days      35 - 160                                      1 month -  1 year      28 - 126                                            2 - 12 years     10 - 147                                           8 - 43 years     49 - 75                                               >17 years     73 - 46                                      Male                                            0 - 30 days      67 - 5  1 month -  1 year      18 - 126                                            2 - 12 years     28 - 147                                           13 - 17 years     64 - 169                                           85 - 60 years     40 - 159                                               >60 years     63 - 139 ug/dL  .  Lipids  Cholesterol, Total  182                              No patient age and/or gender provided                                   or "N" placed in gender box                             Age                Male          Male                          0 - 19 years       53 - 15      100 - 169                             >19 years       100 - 199      100 - 199 mg/dL  Triglycerides  269                              No patient age and/or gender provided                                   or "N" placed in gender box                             Age                 Male          Male  0 -  9 years         0 -  74        0 -  74                         10 - 19 years         0 -  89        0 -  89                             >19 years         0 - 149        0 - 149 mg/dL  HDL Cholesterol  30 Low mg/dL  VLDL Cholesterol Cal  46 High mg/dL 5 - 40 LDL Chol Calc (NIH)  106                              No patient age and/or gender provided                                   or "N" placed in gender box                             Age                Male          Male                          0 - 19 years         0 - 109        0 - 109                             >19 years         0 -  99        0 -  99 mg/dL  T. Chol/HDL Ratio  6.1                              No patient age and/or gender provided                                   or "N" placed in gender box                             Age                Male          Male                           All ages          0.0 - 5.0      0.0 - 4.4 ratio  Please Note:  T. Chol/HDL Ratio                                                            Men  Women                                              1/2 Avg.Risk  3.4    3.3                                                  Avg.Risk  5.0    4.4                                               2X Avg.Risk  9.6    7.1                                               3X Avg.Risk 23.4   11.0 Estimated CHD Risk  CHD Risk cannot be given without patient's sex.                              No patient age and/or gender provided                                   or "N" placed in gender box                             Age                Male          Male                            >19 years        0.0 - 1.0      0.0 - 1.0                The Estimated CHD Risk is based on adult population                and may not apply to children and adolescents <20                years of  age.                The CHD Risk is based on the T. Chol/HDL ratio. Other                factors affect CHD Risk such as hypertension, smoking,  diabetes, severe obesity, and family history of                premature CHD. times avg.  .  Thyroid  TSH  3.960 uIU/mL 0.450 - 4.500 Thyroxine (T4)  7.4 ug/dL 4.5 - 12.0 T3 Uptake  26                              No patient age and/or gender provided                                   or "N" placed in gender box                             Age                Male          Male                          0 - 11 months       79 - 41        23 - 74                          1 -  3 years        69 - 75        24 - 42                          4 -  6 years        43 - 101        24 - 69                          7 - 11 years        47 - 55        22 - 8                         12 - 15 years        32 - 29        23 - 43                         16 - 18 years        78 - 86        23 - 76                             >18 years        24 - 73        24 - 39 %  Free Thyroxine Index  1.9 1.2 - 4.9 .  Immunoassay  Prostate Specific Ag  0.3                              No patient age and/or gender provided  or "N" placed in gender box                             Age                Male          Male                           All Ages          0.0 - 4.0      Not Estab. Roche ECLIA methodology.                                                                      . According to the American Urological Association, Serum PSA should decrease and remain at undetectable levels after radical prostatectomy. The AUA defines biochemical recurrence as an initial PSA value 0.2 ng/mL or greater followed by a subsequent confirmatory PSA value 0.2 ng/mL or greater. Values obtained with different assay methods or kits cannot be used interchangeably. Results cannot be interpreted as absolute evidence of the  presence or absence of malignant disease. ng/mL  .  CBC, Platelet Ct, and Diff  WBC  7.7 x10E3/uL 3.4 - 10.8 RBC  4.11                              No patient age and/or gender provided                                   or "N" placed in gender box                             Age                Male          Male                          0 -  7 days       3.68 - 5.77    3.68 - 5.77                          8 - 30 days       3.29 - 5.50    3.29 - 5.50                         31 - 90 days       2.72 - 4.84    2.72 - 4.84                    91 days - 11 months     3.86 - 5.16    3.86 - 5.16                          1 -  7  years      3.96 - 5.30    3.96 - 5.30                          8 - 12 years      3.91 - 5.45    3.91 - 5.45                             >12 years      4.14 - 5.80    3.77 - 5.28 Few tear drops. Polychromasia present Few schistocytes. x10E6/uL  Hemoglobin  12.7                              No patient age and/or gender provided                                   or "N" placed in gender box                             Age                Male          Male                          0 -  7 days       10.7 - 20.5    10.7 - 20.5                          8 - 30 days       10.5 - 18.7    10.5 - 18.7                         31 - 90 days        8.8 - 14.3     8.8 - 14.3                    91 days - 11 months     10.4 - 14.1    10.4 - 14.1                          1 -  7 years      10.9 - 14.8    10.9 - 14.8                          8 - 12 years      11.7 - 15.7    11.7 - 15.7                         13 - 15 years      12.6 - 17.7    11.1 - 15.9                             >15 years      13.0 - 17.7    11.1 - 15.9 g/dL  Hematocrit  38.2  No patient age and/or gender provided                                   or "N" placed in gender box                             Age                Male          Male                          0 -  7 days        31.9 - 57.2    31.9 - 57.2                          8 - 30 days       30.7 - 53.7    30.7 - 53.7                         31 - 90 days       26.6 - 41.0    26.6 - 41.0                    91 days - 11 months     31.0 - 41.0    31.0 - 41.0                          1 -  7 years      32.4 - 43.3    32.4 - 43.3                          8 - 12 years      34.8 - 45.8    34.8 - 45.8                             >12 years      37.5 - 51.0    34.0 - 46.6 %  MCV  93                              No patient age and/or gender provided                                   or "N" placed in gender box                             Age                Male          Male                          0 -  7 days         34 - 110       43 - 110  8 - 30 days         81 - 109       81 - 109                         31 - 90 days         81 -  97       81 -  97                    91 days - 11 months       73 -  87       73 -  87                          1 -  7 years        75 -  89       75 -  89                          8 - 12 years        68 -  91       77 -  91                             >12 years        32 -  97       79 -  97 fL  MCH  30.9                              No patient age and/or gender provided                                   or "N" placed in gender box                             Age                Male          Male                          0 -  7 days       26.1 - 38.7    26.1 - 38.7                          8 - 30 days       27.5 - 37.6    27.5 - 37.6                         31 - 90 days       27.1 - 34.0    27.1 - 34.0                    91 days - 11 months     24.2 - 30.1    24.2 - 30.1                          1 -  7 years      24.6 - 30.7    24.6 - 30.7                          8 - 12 years      25.7 - 31.5    25.7 - 31.5                             >12 years      26.6 - 33.0    26.6 - 33.0 pg  MCHC  33.2                              No patient age and/or gender  provided                                   or "N" placed in gender box                             Age                Male          Male                          0 -  7 days       31.9 - 36.8    31.9 - 36.8                          8 - 30 days       32.0 - 36.4    32.0 - 36.4                         31 - 90 days       31.9 - 36.0    31.9 - 36.0                    91 days - 11 months     31.5 - 36.0    31.5 - 36.0                          1 - 12 years      31.7 - 36.0    31.7 - 36.0                             >12 years      31.5 - 35.7    31.5 - 35.7 g/dL  RDW  15.8                              No patient age and/or gender provided                                   or "N" placed in gender box  Age                Male          Male                           All ages         16.6 - 15.4    11.7 - 15.4 %  Platelets  201 x10E3/uL 150 - 450 Neutrophils  65 Dohle bodies noted. %  Not Estab. Lymphs  17  %  Not Estab. Monocytes  5  %  Not Estab. Eos  0  %  Not Estab. Basos  2  %  Not Estab. Immature Cells  Note    Bands  2  %  Not Estab. Metamyelocytes  2                              No patient age and/or gender provided                                   or "N" placed in gender box                             Age                Male          Male                          0 - 30 days        Not Estab.     Not Estab.                             >30 days          0 - 0          0 - 0 High %  Myelocytes  4                              No patient age and/or gender provided                                   or "N" placed in gender box                             Age                Male          Male                          0 - 30 days        Not Estab.     Not Estab.                             >30 days          0 - 0  0 - 0 High %  Blasts/blast like cells  3 High % 0 - 0 Neutrophils (Absolute)  5.2                               No patient age and/or gender provided                                   or "N" placed in gender box                             Age                Male          Male                          0 -  7 days        1.2 -  6.1     1.2 -  6.1                          8 - 30 days        1.2 -  4.8     1.2 -  4.8                         31 - 90 days        0.8 -  3.8     0.8 -  3.8                    91 days - 11 months      1.0 -  4.0     1.0 -  4.0                          1 -  7 years       0.9 -  5.4     0.9 -  5.4                          8 - 12 years       1.2 -  6.0     1.2 -  6.0                             >12 years       1.4 -  7.0     1.4 -  7.0 x10E3/uL  Lymphs (Absolute)  1.3                              No patient age and/or gender provided                                   or "N" placed in gender box                             Age  Male          Male                          0 -  7 days        0.9 -  5.0     0.9 -  5.0                          8 - 30 days        0.9 -  9.1     0.9 -  9.1                         31 - 90 days        1.2 -  9.2     1.2 -  9.2                    91 days - 11 months      2.9 -  9.5     2.9 -  9.5                          1 -  7 years       1.6 -  5.9     1.6 -  5.9                          8 - 12 years       1.3 -  3.7     1.3 -  3.7                             >12 years       0.7 -  3.1     0.7 -  3.1 x10E3/uL  Monocytes(Absolute)  0.4                              No patient age and/or gender provided                                   or "N" placed in gender box                             Age                Male          Male                          0 -  7 days        0.2 -  1.3     0.2 -  1.3                          8 - 30 days        0.1 -  1.6     0.1 -  1.6                         31 - 90 days  0.2 -  1.2     0.2 -  1.2                    91 days - 11 months      0.2 -  1.1     0.2 -  1.1                           1 -  7 years       0.2 -  1.0     0.2 -  1.0                          8 - 12 years       0.1 -  0.8     0.1 -  0.8                             >12 years       0.1 -  0.9     0.1 -  0.9 x10E3/uL  Eos (Absolute)  0.0                              No patient age and/or gender provided                                   or "N" placed in gender box                             Age                Male          Male                          0 -  7 days        0.0 -  0.6     0.0 -  0.6                          8 - 30 days        0.0 -  0.7     0.0 -  0.7                    31 days - 11 months      0.0 -  0.4     0.0 -  0.4                          1 -  7 years       0.0 -  0.3     0.0 -  0.3                              >7 years       0.0 -  0.4     0.0 -  0.4 x10E3/uL  Baso (Absolute)  0.2  No patient age and/or gender provided                                   or "N" placed in gender box                             Age                Male          Male                          0 -  7 days        0.0 -  0.6     0.0 -  0.6                     8 days - 11 months      0.0 -  0.4     0.0 -  0.4                          1 - 17 years       0.0 -  0.3     0.0 -  0.3                             >17 years       0.0 -  0.2     0.0 -  0.2 x10E3/uL  NRBC  3                              No patient age and/or gender provided                                   or "N" placed in gender box                             Age                Male          Male                          0 - 30 days        Not Estab.     Not Estab.                             >30 days          0 - 0          0 - 0 High %  Hematology Comments:  Manual differential was performed. Slide checked by Pathologist. Correlation and Clinically appropriate follow up suggested. Leukoerythroblastic reaction. Dr. Perlie Gold Hemoglobin A1c [phone]) Test Results Flag Units Reference Interval Hemoglobin  A1c  5.8 High % 4.8 - 5.6 Please Note:                                                         .  Prediabetes: 5.7 - 6.4          Diabetes: >6.4          Glycemic control for adults with diabetes: <7.0 Vitamin D, 25-Hydroxy (#[id number]) Test Results Flag Units Reference Interval Vitamin D, 25-Hydroxy  29.0 Vitamin D deficiency has been defined by the [place] [employer] practice guideline as a level of serum 25-OH vitamin D less than 20 ng/mL (1,2). The Endocrine Society went on to further define vitamin D insufficiency as a level between 21 and 29 ng/mL (2). 1. IOM (Institute of Medicine). 2010. Dietary reference    intakes for calcium and D. [place]: The    [employer]. 2. Holick MF, Binkley [place], Bischoff-Ferrari HA, et al.    Evaluation, treatment, and prevention of vitamin D    deficiency: an Endocrine Society clinical practice    guideline. JCEM. 2011 Jul; 96(7):1911-30. Low ng/mL 30.0 - 100

## 2022-03-06 ENCOUNTER — Ambulatory Visit: Payer: Self-pay | Admitting: *Deleted

## 2022-03-06 VITALS — BP 121/77 | Ht 66.0 in | Wt 176.0 lb

## 2022-03-06 DIAGNOSIS — Z0000 Encounter for general adult medical examination without abnormal findings: Secondary | ICD-10-CM

## 2022-03-06 NOTE — Progress Notes (Signed)
Results reviewed with pt in clinic. Taking 2000units Vit D currently. Will increase to 4000units daily. Diet and exercise recommendations discussed re: A1c, lipids, wt management. Routine f/u with pcp. Copy of labs provided to pt. Results routed to pcp and hematology. No further questions/concerns.

## 2022-03-06 NOTE — Telephone Encounter (Signed)
RN Hildred Alamin reviewed results with patient in clinic today and forwarded results to hematology for review.  "Results reviewed with pt in clinic. Taking 2000units Vit D currently. Will increase to 4000units daily. Diet and exercise recommendations discussed re: A1c, lipids, wt management. Routine f/u with pcp. Copy of labs provided to pt. Results routed to pcp and hematology. No further questions/concerns. "

## 2022-03-06 NOTE — Progress Notes (Signed)
[employer] insurance premium discount evaluation: Labs Drawn by [facility] onsite previously. Replacements ROI form signed. Tobacco Free Attestation form signed.  Forms placed in paper chart.

## 2022-03-06 NOTE — Progress Notes (Signed)
Reviewed RN Hildred Alamin note.  Patient increasing vitamin D to 4000 units daily from 2000 units per recommendations and following up with [facility] and results also sent to hematology by RN Hildred Alamin

## 2022-04-10 ENCOUNTER — Telehealth: Payer: Self-pay | Admitting: Oncology

## 2022-04-10 NOTE — Telephone Encounter (Signed)
Rescheduled August appointments per provider pal, called to inform patient about new upcoming rescheduled appointment. Patient is notified.

## 2022-05-16 ENCOUNTER — Ambulatory Visit: Payer: No Typology Code available for payment source | Admitting: Oncology

## 2022-05-16 ENCOUNTER — Other Ambulatory Visit: Payer: No Typology Code available for payment source

## 2022-05-19 NOTE — Telephone Encounter (Signed)
Epic reviewed patient has appt scheduled with hematology 05/29/22.

## 2022-05-29 ENCOUNTER — Inpatient Hospital Stay: Payer: No Typology Code available for payment source | Attending: Oncology | Admitting: Oncology

## 2022-05-29 ENCOUNTER — Inpatient Hospital Stay: Payer: No Typology Code available for payment source

## 2022-05-29 ENCOUNTER — Telehealth: Payer: Self-pay | Admitting: *Deleted

## 2022-05-29 NOTE — Telephone Encounter (Signed)
PC to patient regarding missed appointments, no answer, left VM - informed patient our scheduling department will call to reschedule.  Scheduling message sent.

## 2022-05-30 ENCOUNTER — Telehealth: Payer: Self-pay | Admitting: Oncology

## 2022-05-30 NOTE — Telephone Encounter (Signed)
Scheduled per 9/5 in basket, pt has been called and confirmed  

## 2022-06-06 NOTE — Telephone Encounter (Signed)
Per epic patient no show 05/29/22 appt and rescheduled to 07/11/22 for office visit and labs with hematology.

## 2022-07-07 NOTE — Telephone Encounter (Signed)
[place] stated [place] and missed last hematology appt. Appt now rescheduled and denied concerns.

## 2022-07-10 ENCOUNTER — Telehealth: Payer: Self-pay | Admitting: Oncology

## 2022-07-10 NOTE — Telephone Encounter (Signed)
Per 10/17 phone line pt called to r/s appointment

## 2022-07-11 ENCOUNTER — Inpatient Hospital Stay: Payer: No Typology Code available for payment source | Admitting: Oncology

## 2022-07-11 ENCOUNTER — Inpatient Hospital Stay: Payer: No Typology Code available for payment source

## 2022-07-12 NOTE — Telephone Encounter (Signed)
Next appt 07/26/22 with hematology rescheduled

## 2022-07-26 ENCOUNTER — Inpatient Hospital Stay: Payer: No Typology Code available for payment source | Attending: Oncology

## 2022-07-26 ENCOUNTER — Inpatient Hospital Stay (HOSPITAL_BASED_OUTPATIENT_CLINIC_OR_DEPARTMENT_OTHER): Payer: No Typology Code available for payment source | Admitting: Oncology

## 2022-07-26 VITALS — BP 131/85 | HR 65 | Temp 97.9°F | Resp 17 | Ht 66.0 in | Wt 180.1 lb

## 2022-07-26 DIAGNOSIS — Z79899 Other long term (current) drug therapy: Secondary | ICD-10-CM | POA: Insufficient documentation

## 2022-07-26 DIAGNOSIS — D649 Anemia, unspecified: Secondary | ICD-10-CM

## 2022-07-26 LAB — CBC WITH DIFFERENTIAL (CANCER CENTER ONLY)
Abs Immature Granulocytes: 0.47 10*3/uL — ABNORMAL HIGH (ref 0.00–0.07)
Basophils Absolute: 0 10*3/uL (ref 0.0–0.1)
Basophils Relative: 1 %
Eosinophils Absolute: 0 10*3/uL (ref 0.0–0.5)
Eosinophils Relative: 0 %
HCT: 36.6 % — ABNORMAL LOW (ref 39.0–52.0)
Hemoglobin: 12.2 g/dL — ABNORMAL LOW (ref 13.0–17.0)
Immature Granulocytes: 8 %
Lymphocytes Relative: 19 %
Lymphs Abs: 1.1 10*3/uL (ref 0.7–4.0)
MCH: 31.5 pg (ref 26.0–34.0)
MCHC: 33.3 g/dL (ref 30.0–36.0)
MCV: 94.6 fL (ref 80.0–100.0)
Monocytes Absolute: 0.3 10*3/uL (ref 0.1–1.0)
Monocytes Relative: 6 %
Neutro Abs: 3.7 10*3/uL (ref 1.7–7.7)
Neutrophils Relative %: 66 %
Platelet Count: 172 10*3/uL (ref 150–400)
RBC: 3.87 MIL/uL — ABNORMAL LOW (ref 4.22–5.81)
RDW: 16.4 % — ABNORMAL HIGH (ref 11.5–15.5)
WBC Count: 5.6 10*3/uL (ref 4.0–10.5)
WBC Morphology: 3
nRBC: 1.6 % — ABNORMAL HIGH (ref 0.0–0.2)

## 2022-07-26 LAB — CMP (CANCER CENTER ONLY)
ALT: 28 U/L (ref 0–44)
AST: 21 U/L (ref 15–41)
Albumin: 4.4 g/dL (ref 3.5–5.0)
Alkaline Phosphatase: 52 U/L (ref 38–126)
Anion gap: 6 (ref 5–15)
BUN: 18 mg/dL (ref 8–23)
CO2: 26 mmol/L (ref 22–32)
Calcium: 9.4 mg/dL (ref 8.9–10.3)
Chloride: 106 mmol/L (ref 98–111)
Creatinine: 1.08 mg/dL (ref 0.61–1.24)
GFR, Estimated: >60 mL/min (ref >60)
Glucose, Bld: 137 mg/dL — ABNORMAL HIGH (ref 70–99)
Potassium: 4.2 mmol/L (ref 3.5–5.1)
Sodium: 138 mmol/L (ref 135–145)
Total Bilirubin: 1.4 mg/dL — ABNORMAL HIGH (ref 0.3–1.2)
Total Protein: 6.5 g/dL (ref 6.5–8.1)

## 2022-07-26 NOTE — Progress Notes (Signed)
Hematology and Oncology Follow Up Visit  Andrew Mccoy [id number] 09/21/1956 66 y.o. 07/26/2022 8:43 AM Pcp, NoNo ref. provider found   Principle Diagnosis: 66 year old man with mild anemia and nucleated red blood cells on his peripheral smear indicating a possible myeloproliferative disorder diagnosed in 2020.   Current therapy: Observation and surveillance.  Interim History: Andrew Mccoy presents today for a repeat evaluation.  Since last visit, he reports no major changes in his health.  He continues to be active and attends to activities of daily living.  He denies any chest pain or shortness of breath.  He denies any excessive fatigue or tiredness.  He denies any abdominal pain or discomfort.       Medications: Updated on review. Current Outpatient Medications  Medication Sig Dispense Refill   Cholecalciferol (VITAMIN D3) 50 MCG (2000 UT) capsule Take 1 capsule by mouth daily.     Coenzyme Q10 (CO Q-10) 50 MG CAPS Take 1 capsule by mouth daily.     sodium chloride (OCEAN) 0.65 % SOLN nasal spray Place 2 sprays into both nostrils every 2 (two) hours while awake.  0   No current facility-administered medications for this visit.     Allergies: No Known Allergies    Physical Exam:  Blood pressure 131/85, pulse 65, temperature 97.9 F (36.6 C), temperature source Temporal, resp. rate 17, height _0  (1.676 m), weight 180 lb 1.6 oz (81.7 kg), SpO2 99 %.     ECOG: 0   General appearance: Comfortable appearing without any discomfort Head: Normocephalic without any trauma Oropharynx: Mucous membranes are moist and pink without any thrush or ulcers. Eyes: Pupils are equal and round reactive to light. Lymph nodes: No cervical, supraclavicular, inguinal or axillary lymphadenopathy.   Heart:regular rate and rhythm.  S1 and S2 without leg edema. Lung: Clear without any rhonchi or wheezes.  No dullness to percussion. Abdomin: Soft, nontender, nondistended with good bowel  sounds.  No splenomegaly palpated on exam. Musculoskeletal: No joint deformity or effusion.  Full range of motion noted. Neurological: No deficits noted on motor, sensory and deep tendon reflex exam. Skin: No petechial rash or dryness.  Appeared moist.          Lab Results: Lab Results  Component Value Date   WBC 7.7 03/01/2022   HGB 12.7 03/01/2022   HCT 38.2 03/01/2022   MCV 93 03/01/2022   PLT 201 03/01/2022     Chemistry      Component Value Date/Time   NA 137 03/01/2022 1100   K 4.5 03/01/2022 1100   CL 101 03/01/2022 1100   CO2 22 04/14/2019 1102   BUN 17 03/01/2022 1100   CREATININE 1.10 03/01/2022 1100   CREATININE 1.07 04/14/2019 1102      Component Value Date/Time   CALCIUM 9.7 03/01/2022 1100   ALKPHOS 65 03/01/2022 1100   AST 23 03/01/2022 1100   AST 22 04/14/2019 1102   ALT 25 03/01/2022 1100   ALT 26 04/14/2019 1102   BILITOT 1.1 03/01/2022 1100   BILITOT 1.1 04/14/2019 1102        Impression and Plan:   66 year old man with:  1.  Anemia with nucleated red cells on his peripheral smear noted in 2020.  His hemoglobin have ranged between 12-13 with normal indices and normal white cell count and platelets.  His hemoglobin on March 01, 2022 was 12.7 with normal white cell count and platelets.  CBC from today was also reviewed and showed a hemoglobin of 12.2  is consistent with his baseline.  He had 3 nucleated red cells normal differential.  The differential diagnosis was reviewed today with the patient and management options were discussed.  Myeloproliferative disorder including marrow fibrosis among others are a consideration.  After recommended bone marrow biopsy for the last few years the patient has declined and opted for active surveillance.  After discussion today, he opted to continue with active surveillance.  We will continue to monitor his counts every 6 months.  We will suggest a bone marrow biopsy again he has any drop in other cell  lines.  2.  Follow-up: In 6 months for repeat follow-up.   30  minutes were spent on this encounter.  The time was dedicated to reviewing laboratory data, disease status update management choices for the future.     Zola Button, MD 11/2/20238:43 AM

## 2022-07-27 ENCOUNTER — Telehealth: Payer: Self-pay | Admitting: Registered Nurse

## 2022-07-27 LAB — KAPPA/LAMBDA LIGHT CHAINS
Kappa free light chain: 18 mg/L (ref 3.3–19.4)
Kappa, lambda light chain ratio: 1.57 (ref 0.26–1.65)
Lambda free light chains: 11.5 mg/L (ref 5.7–26.3)

## 2022-07-27 NOTE — Telephone Encounter (Signed)
Patient saw hematology Dr Alen Blew yesterday  CBC stable.  Patient refused bone marrow biopsy and wants to continue conservative monitoring.  Next appt/labs in 6 months.  Epic office note reviewed and will follow up with patient when onsite next week to see if any further questions.

## 2022-07-30 LAB — MULTIPLE MYELOMA PANEL, SERUM
Albumin SerPl Elph-Mcnc: 4.2 g/dL (ref 2.9–4.4)
Albumin/Glob SerPl: 2.1 — ABNORMAL HIGH (ref 0.7–1.7)
Alpha 1: 0.2 g/dL (ref 0.0–0.4)
Alpha2 Glob SerPl Elph-Mcnc: 0.4 g/dL (ref 0.4–1.0)
B-Globulin SerPl Elph-Mcnc: 0.8 g/dL (ref 0.7–1.3)
Gamma Glob SerPl Elph-Mcnc: 0.7 g/dL (ref 0.4–1.8)
Globulin, Total: 2.1 g/dL — ABNORMAL LOW (ref 2.2–3.9)
IgA: 128 mg/dL (ref 61–437)
IgG (Immunoglobin G), Serum: 670 mg/dL (ref 603–1613)
IgM (Immunoglobulin M), Srm: 34 mg/dL (ref 20–172)
Total Protein ELP: 6.3 g/dL (ref 6.0–8.5)

## 2022-08-13 ENCOUNTER — Telehealth: Payer: Self-pay | Admitting: Oncology

## 2022-08-13 NOTE — Telephone Encounter (Signed)
Informed patient regarding provider's departure, rescheduled May appointment with new provider. Patient is notified.

## 2022-10-31 NOTE — Telephone Encounter (Signed)
See tcon dated 07/27/22

## 2022-12-14 ENCOUNTER — Telehealth: Payer: Self-pay | Admitting: Registered Nurse

## 2022-12-14 ENCOUNTER — Encounter: Payer: Self-pay | Admitting: Registered Nurse

## 2022-12-14 DIAGNOSIS — Z0000 Encounter for general adult medical examination without abnormal findings: Secondary | ICD-10-CM

## 2022-12-14 NOTE — Telephone Encounter (Signed)
Epic reviewed Vitamin D, executive panel and hgba1c due Jun 2024 fasting ordered RN Kirmey notified to schedule

## 2022-12-18 NOTE — Telephone Encounter (Signed)
Patient has switched to part time work and no longer has health benefits through [employer] per RN Kimrey/HR

## 2022-12-22 NOTE — Telephone Encounter (Signed)
Patient has decreased to part time hours at work and not onsite when NP onsite/attempted follow up

## 2023-01-21 ENCOUNTER — Telehealth: Payer: Self-pay | Admitting: Oncology

## 2023-01-21 ENCOUNTER — Other Ambulatory Visit: Payer: Self-pay | Admitting: *Deleted

## 2023-01-21 DIAGNOSIS — D649 Anemia, unspecified: Secondary | ICD-10-CM

## 2023-01-21 NOTE — Telephone Encounter (Signed)
Patient called to cancel appointment he will call back to reschedule later

## 2023-01-24 ENCOUNTER — Inpatient Hospital Stay: Payer: No Typology Code available for payment source

## 2023-01-24 ENCOUNTER — Inpatient Hospital Stay: Payer: No Typology Code available for payment source | Admitting: Oncology

## 2023-01-25 ENCOUNTER — Ambulatory Visit: Payer: No Typology Code available for payment source | Admitting: Oncology

## 2023-01-25 ENCOUNTER — Other Ambulatory Visit: Payer: No Typology Code available for payment source

## 2023-03-10 NOTE — Progress Notes (Signed)
noted 

## 2023-03-13 DIAGNOSIS — Z85828 Personal history of other malignant neoplasm of skin: Secondary | ICD-10-CM | POA: Diagnosis not present

## 2023-03-13 DIAGNOSIS — L821 Other seborrheic keratosis: Secondary | ICD-10-CM | POA: Diagnosis not present

## 2023-03-13 DIAGNOSIS — L814 Other melanin hyperpigmentation: Secondary | ICD-10-CM | POA: Diagnosis not present

## 2023-03-13 DIAGNOSIS — D225 Melanocytic nevi of trunk: Secondary | ICD-10-CM | POA: Diagnosis not present

## 2023-03-13 DIAGNOSIS — Z08 Encounter for follow-up examination after completed treatment for malignant neoplasm: Secondary | ICD-10-CM | POA: Diagnosis not present

## 2023-03-25 ENCOUNTER — Ambulatory Visit: Payer: No Typology Code available for payment source | Admitting: Occupational Medicine

## 2023-03-25 DIAGNOSIS — M79622 Pain in left upper arm: Secondary | ICD-10-CM

## 2023-03-25 NOTE — Progress Notes (Signed)
Patient reports pain when when flexing left upper arm. No pain when pushing down. No injury. Patient has appt with ortho on Friday. Patient given Ibuprofen, tylenol, Thermacare, Biofreeze and ice pack to use as needed for pain. Educated if not improving can contact clinic.

## 2023-03-27 DIAGNOSIS — M7532 Calcific tendinitis of left shoulder: Secondary | ICD-10-CM | POA: Diagnosis not present

## 2023-04-19 ENCOUNTER — Other Ambulatory Visit: Payer: Self-pay | Admitting: Registered Nurse

## 2023-05-22 DIAGNOSIS — M25512 Pain in left shoulder: Secondary | ICD-10-CM | POA: Diagnosis not present

## 2023-05-22 DIAGNOSIS — Z1211 Encounter for screening for malignant neoplasm of colon: Secondary | ICD-10-CM | POA: Diagnosis not present

## 2023-05-22 DIAGNOSIS — Z131 Encounter for screening for diabetes mellitus: Secondary | ICD-10-CM | POA: Diagnosis not present

## 2023-05-22 DIAGNOSIS — E785 Hyperlipidemia, unspecified: Secondary | ICD-10-CM | POA: Diagnosis not present

## 2023-05-22 DIAGNOSIS — Z0000 Encounter for general adult medical examination without abnormal findings: Secondary | ICD-10-CM | POA: Diagnosis not present

## 2023-05-22 DIAGNOSIS — E559 Vitamin D deficiency, unspecified: Secondary | ICD-10-CM | POA: Diagnosis not present

## 2023-05-22 DIAGNOSIS — Z23 Encounter for immunization: Secondary | ICD-10-CM | POA: Diagnosis not present

## 2023-05-22 DIAGNOSIS — Z125 Encounter for screening for malignant neoplasm of prostate: Secondary | ICD-10-CM | POA: Diagnosis not present

## 2023-05-22 DIAGNOSIS — Z6828 Body mass index (BMI) 28.0-28.9, adult: Secondary | ICD-10-CM | POA: Diagnosis not present

## 2023-06-21 DIAGNOSIS — Z008 Encounter for other general examination: Secondary | ICD-10-CM | POA: Diagnosis not present

## 2023-06-21 DIAGNOSIS — E785 Hyperlipidemia, unspecified: Secondary | ICD-10-CM | POA: Diagnosis not present

## 2023-06-21 DIAGNOSIS — F17211 Nicotine dependence, cigarettes, in remission: Secondary | ICD-10-CM | POA: Diagnosis not present

## 2023-06-21 DIAGNOSIS — Z6828 Body mass index (BMI) 28.0-28.9, adult: Secondary | ICD-10-CM | POA: Diagnosis not present

## 2023-06-21 DIAGNOSIS — E663 Overweight: Secondary | ICD-10-CM | POA: Diagnosis not present

## 2023-10-09 DIAGNOSIS — R7303 Prediabetes: Secondary | ICD-10-CM | POA: Diagnosis not present

## 2023-10-09 DIAGNOSIS — E663 Overweight: Secondary | ICD-10-CM | POA: Diagnosis not present

## 2023-10-09 DIAGNOSIS — Z6827 Body mass index (BMI) 27.0-27.9, adult: Secondary | ICD-10-CM | POA: Diagnosis not present

## 2023-10-09 DIAGNOSIS — Z008 Encounter for other general examination: Secondary | ICD-10-CM | POA: Diagnosis not present

## 2023-10-09 DIAGNOSIS — E785 Hyperlipidemia, unspecified: Secondary | ICD-10-CM | POA: Diagnosis not present

## 2024-04-01 DIAGNOSIS — L559 Sunburn, unspecified: Secondary | ICD-10-CM | POA: Diagnosis not present

## 2024-04-01 DIAGNOSIS — D225 Melanocytic nevi of trunk: Secondary | ICD-10-CM | POA: Diagnosis not present

## 2024-04-01 DIAGNOSIS — Z85828 Personal history of other malignant neoplasm of skin: Secondary | ICD-10-CM | POA: Diagnosis not present

## 2024-04-01 DIAGNOSIS — L814 Other melanin hyperpigmentation: Secondary | ICD-10-CM | POA: Diagnosis not present

## 2024-04-01 DIAGNOSIS — Z08 Encounter for follow-up examination after completed treatment for malignant neoplasm: Secondary | ICD-10-CM | POA: Diagnosis not present

## 2024-04-01 DIAGNOSIS — L821 Other seborrheic keratosis: Secondary | ICD-10-CM | POA: Diagnosis not present
